# Patient Record
Sex: Female | Born: 1990 | Race: White | Hispanic: No | Marital: Single | Smoking: Never smoker
Health system: Southern US, Community
[De-identification: ages and names within clinical notes are randomized; demographics above are authoritative.]

## PROBLEM LIST (undated history)

## (undated) ENCOUNTER — Inpatient Hospital Stay (HOSPITAL_COMMUNITY): Payer: Self-pay

## (undated) DIAGNOSIS — N730 Acute parametritis and pelvic cellulitis: Secondary | ICD-10-CM

## (undated) DIAGNOSIS — Z789 Other specified health status: Secondary | ICD-10-CM

## (undated) DIAGNOSIS — D649 Anemia, unspecified: Secondary | ICD-10-CM

## (undated) DIAGNOSIS — T8859XA Other complications of anesthesia, initial encounter: Secondary | ICD-10-CM

## (undated) DIAGNOSIS — T4145XA Adverse effect of unspecified anesthetic, initial encounter: Secondary | ICD-10-CM

## (undated) HISTORY — PX: LAPAROSCOPY: SHX197

## (undated) HISTORY — PX: LEEP: SHX91

---
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
## 2010-05-11 LAB — CBC
HCT: 39.1 % (ref 36.0–46.0)
Hemoglobin: 13.1 g/dL (ref 12.0–15.0)
MCHC: 33.6 g/dL (ref 30.0–36.0)
MCV: 90.8 fL (ref 78.0–100.0)
Platelets: 285 10*3/uL (ref 150–400)
RBC: 4.3 MIL/uL (ref 3.87–5.11)
RDW: 13 % (ref 11.5–15.5)
WBC: 5.6 10*3/uL (ref 4.0–10.5)

## 2010-05-11 LAB — PREGNANCY, URINE: Preg Test, Ur: NEGATIVE

## 2010-08-13 ENCOUNTER — Inpatient Hospital Stay (HOSPITAL_COMMUNITY): Payer: BC Managed Care – PPO

## 2010-08-13 ENCOUNTER — Encounter (HOSPITAL_COMMUNITY): Payer: Self-pay

## 2010-08-13 ENCOUNTER — Inpatient Hospital Stay (HOSPITAL_COMMUNITY)
Admission: AD | Admit: 2010-08-13 | Discharge: 2010-08-13 | Disposition: A | Discharge status: Home / Self Care | Payer: BC Managed Care – PPO | Source: Ambulatory Visit | Attending: Obstetrics and Gynecology | Admitting: Obstetrics and Gynecology

## 2010-08-13 ENCOUNTER — Inpatient Hospital Stay (HOSPITAL_COMMUNITY)
Admission: AD | Admit: 2010-08-13 | Payer: BC Managed Care – PPO | Source: Ambulatory Visit | Admitting: Obstetrics and Gynecology

## 2010-08-13 DIAGNOSIS — O200 Threatened abortion: Secondary | ICD-10-CM | POA: Diagnosis present

## 2010-08-13 DIAGNOSIS — N760 Acute vaginitis: Secondary | ICD-10-CM | POA: Insufficient documentation

## 2010-08-13 DIAGNOSIS — A499 Bacterial infection, unspecified: Secondary | ICD-10-CM | POA: Insufficient documentation

## 2010-08-13 DIAGNOSIS — O2390 Unspecified genitourinary tract infection in pregnancy, unspecified trimester: Secondary | ICD-10-CM | POA: Insufficient documentation

## 2010-08-13 DIAGNOSIS — R109 Unspecified abdominal pain: Secondary | ICD-10-CM | POA: Insufficient documentation

## 2010-08-13 DIAGNOSIS — B9689 Other specified bacterial agents as the cause of diseases classified elsewhere: Secondary | ICD-10-CM | POA: Diagnosis present

## 2010-08-13 HISTORY — DX: Other specified health status: Z78.9

## 2010-08-13 LAB — WET PREP, GENITAL
Trich, Wet Prep: NONE SEEN
Yeast Wet Prep HPF POC: NONE SEEN

## 2010-08-13 LAB — CBC
HCT: 38.2 % (ref 36.0–46.0)
Hemoglobin: 13.2 g/dL (ref 12.0–15.0)
MCH: 30.6 pg (ref 26.0–34.0)
MCHC: 34.6 g/dL (ref 30.0–36.0)
MCV: 88.6 fL (ref 78.0–100.0)
Platelets: 283 10*3/uL (ref 150–400)
RBC: 4.31 MIL/uL (ref 3.87–5.11)
WBC: 8.2 10*3/uL (ref 4.0–10.5)

## 2010-08-13 LAB — COMPREHENSIVE METABOLIC PANEL WITH GFR
ALT: 9 U/L (ref 0–35)
AST: 15 U/L (ref 0–37)
Albumin: 4.2 g/dL (ref 3.5–5.2)
Alkaline Phosphatase: 45 U/L (ref 39–117)
BUN: 10 mg/dL (ref 6–23)
CO2: 21 meq/L (ref 19–32)
Calcium: 9.5 mg/dL (ref 8.4–10.5)
Chloride: 102 meq/L (ref 96–112)
Creatinine, Ser: 0.53 mg/dL (ref 0.50–1.10)
GFR calc Af Amer: >60 mL/min
GFR calc non Af Amer: >60 mL/min
Glucose, Bld: 86 mg/dL (ref 70–99)
Potassium: 3.4 meq/L — ABNORMAL LOW (ref 3.5–5.1)
Sodium: 135 meq/L (ref 135–145)
Total Bilirubin: 0.2 mg/dL — ABNORMAL LOW (ref 0.3–1.2)
Total Protein: 8 g/dL (ref 6.0–8.3)

## 2010-08-13 LAB — ABO/RH: ABO/RH(D): O POS

## 2010-08-13 LAB — HCG, QUANTITATIVE, PREGNANCY: hCG, Beta Chain, Quant, S: 3241 m[IU]/mL — ABNORMAL HIGH (ref <5)

## 2010-08-13 MED ORDER — METRONIDAZOLE 500 MG PO TABS: 500.0000 mg | ORAL_TABLET | Freq: Two times a day (BID) | ORAL | Status: AC

## 2010-08-13 NOTE — Progress Notes (Signed)
Onset of abdominal pain x 2 days, bleeding since last night heavy, this afternoon lighter, none on pantyliner just in the toilet after urination

## 2010-08-13 NOTE — Progress Notes (Signed)
Pt given prescription and follow up lab prescription.  Verbalized understanding.

## 2010-08-13 NOTE — Progress Notes (Signed)
Subjective: Pt is 20y.o. 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  Also reports spotting with wiping since woke this AM.  No fever, chills, GU or respiratory c/o's.  No N/V.  No current meds.  Positive UPT Friday.  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  Pt recently started new job at [employer] and also has additional p/t job, so difficulty arranging appointments during usual office hours. Pt doesn't keep track of menstrual cycles, so unsure LMP, but "thinks"  Around 07/07/10.  Chief Complaint  Patient presents with  . Abdominal Pain    pt reports she did a home preg test Friday night and it was positive, has been cramping x 1 week, states she has had pain off/on since 10/10 when she had a lap for "PID"  . Vaginal Bleeding    blood on tissue when she wipes    History   Social History  . Marital Status: Single    Spouse Name: N/A    Number of Children: N/A  . Years of Education: N/A   Occupational History  . Not on file.   Social History Main Topics  . Smoking status: Never Smoker   . Smokeless tobacco: Not on file  . Alcohol Use: No  . Drug Use: No  . Sexually Active: Yes   Other Topics Concern  . Not on file   Social History Narrative  . No narrative on file   Past Surgical History  Procedure Date  . Laparoscopy 11-2008   OB History    Grav Para Term Preterm Abortions TAB SAB Ect Mult Living   1              Past Medical History  Diagnosis Date  . PID H/o frequent UTI's 2010   Objective: Vital signs in last 24 hours: Temp:  [98.1 F (36.7 C)] 98.1 F (36.7 C) (07/08 1627) Pulse Rate:  [93-96] 93  (07/08 2025) Resp:  [16-18] 18  (07/08 2025) BP: (121-125)/(77-78) 125/77 mmHg (07/08 2025) Weight:  [49.896 kg (110 lb)] 110 lb (49.896 kg) (07/08 1627)     PHYSICAL EXAM: General:  NAD, A&Ox3; tearful on arrival to  room Abdomen:  Soft, NT, no rebound or guarding Pelvic:  Ut Nml shape & size; Spec:  Small amt tan/pink malodorous d/c in vault.  Ovaries/adnexa WNL.  Cx:  Closed, long, firm; slightly stenotic.  Chadwick's sign. Extremities:  WNL Skin:  Warm & dry   Results for orders placed during the hospital encounter of 08/13/10 (from the past 24 hour(s))  CBC     Status: Normal   Collection Time   08/13/10  5:13 PM      Component Value Range   WBC 8.2  4.0 - 10.5 (K/uL)   RBC 4.31  3.87 - 5.11 (MIL/uL)   Hemoglobin 13.2  12.0 - 15.0 (g/dL)   HCT 04.5  40.9 - 81.1 (%)   MCV 88.6  78.0 - 100.0 (fL)   MCH 30.6  26.0 - 34.0 (pg)   MCHC 34.6  30.0 - 36.0 (g/dL)   RDW 91.4  78.2 - 95.6 (%)   Platelets 283  150 - 400 (K/uL)      *Note: This is not all of the results for the requested time period. They were limited due to a high amount of results. Please view the rest in Results Review.  CMET WNL except Potassium slightly low =3.4 Quantitative hcg=3,241 ABO/RH= O positive Wet Prep:  WNL except few clue, few WBC's, moderate bacteria   Studies/Results: US Ob Comp Less 14 Wks  08/13/2010  *RADIOLOGY REPORT*  Clinical Data: Pregnant female with lower abdominal pain and cramping.  Quantitative HCG 3,241.  OBSTETRIC <14 WK [place] AND TRANSVAGINAL OB US  Technique:  Both transabdominal and transvaginal ultrasound examinations were performed for complete evaluation of the gestation as well as the maternal uterus, adnexal regions, and pelvic cul-de-sac.  Transvaginal technique was performed to assess early pregnancy.  Comparison:  None.  Intrauterine gestational sac:  Visualized/normal in shape. Yolk sac: Not visualized. Embryo: Not visualized. Cardiac Activity: Not applicable. MSD: 4.7  mm  5    w 0    d         [place] EDC: 04/15/2011  Maternal uterus/adnexae: Unremarkable.  IMPRESSION: Cystic structure within the uterus is compatible with a gestational sac.  No fetal pole or yolk sac is visualized.  No adnexal mass is  seen.  Differential considerations include normal IUP, missed abortion or ectopic pregnancy.  Recommend follow-up quantitative HCG.  Repeat ultrasound as needed.  Original Report Authenticated By: [id number]   US Ob Transvaginal  08/13/2010  *RADIOLOGY REPORT*  Clinical Data: Pregnant female with lower abdominal pain and cramping.  Quantitative HCG 3,241.  OBSTETRIC <14 WK [place] AND TRANSVAGINAL OB US  Technique:  Both transabdominal and transvaginal ultrasound examinations were performed for complete evaluation of the gestation as well as the maternal uterus, adnexal regions, and pelvic cul-de-sac.  Transvaginal technique was performed to assess early pregnancy.  Comparison:  None.  Intrauterine gestational sac:  Visualized/normal in shape. Yolk sac: Not visualized. Embryo: Not visualized. Cardiac Activity: Not applicable. MSD: 4.7  mm  5    w 0    d         [place] EDC: 04/15/2011  Maternal uterus/adnexae: Unremarkable.  IMPRESSION: Cystic structure within the uterus is compatible with a gestational sac.  No fetal pole or yolk sac is visualized.  No adnexal mass is seen.  Differential considerations include normal IUP, missed abortion or ectopic pregnancy.  Recommend follow-up quantitative HCG.  Repeat ultrasound as needed.  Original Report Authenticated By: [id number]    Assessment:   1. Early pregnancy 2.  Threatened AB (first trimester vaginal bleeding) 3.  Bacterial Vaginosis 4.  Unsure LMP 5.  RH positive 6.  Slight Hypokalemia 7.  H/O PID    Plan: 1.  D/c'd home with bleeding & SAB precautions--given script for outpatient stat quant Tuesday 08/15/10, to do here at [facility] secondary to work schedule 2.  O/c CNM will call patient Tuesday night w/ results, & office will call Wednesday to make f/u ultrasound if indicated, & further POC. 3.  Flagyl 500mg  po bid x7d called into [facility] off [address]. 4.  Obtain OTC PNV 5.  F/U as instructed or prn    Jarrett Albor H

## 2010-08-13 NOTE — Progress Notes (Signed)
Eustace Pen, CNM in with pt.  [place] results discussed.

## 2010-08-15 LAB — GC/CHLAMYDIA PROBE AMP, GENITAL
Chlamydia, DNA Probe: POSITIVE — AB
GC Probe Amp, Genital: NEGATIVE

## 2010-11-10 LAB — COMPREHENSIVE METABOLIC PANEL
ALT: 14 U/L (ref 0–35)
AST: 22 U/L (ref 0–37)
Albumin: 4.1 g/dL (ref 3.5–5.2)
Alkaline Phosphatase: 54 U/L (ref 47–119)
BUN: 9 mg/dL (ref 6–23)
CO2: 23 mEq/L (ref 19–32)
Calcium: 9.7 mg/dL (ref 8.4–10.5)
Chloride: 103 mEq/L (ref 96–112)
Creatinine, Ser: 0.7 mg/dL (ref 0.4–1.2)
Glucose, Bld: 95 mg/dL (ref 70–99)
Potassium: 3.1 mEq/L — ABNORMAL LOW (ref 3.5–5.1)
Sodium: 138 mEq/L (ref 135–145)
Total Bilirubin: 0.5 mg/dL (ref 0.3–1.2)
Total Protein: 7.9 g/dL (ref 6.0–8.3)

## 2010-11-10 LAB — URINE MICROSCOPIC-ADD ON

## 2010-11-10 LAB — DIFFERENTIAL
Basophils Absolute: 0 10*3/uL (ref 0.0–0.1)
Basophils Relative: 0 % (ref 0–1)
Eosinophils Absolute: 0.1 10*3/uL (ref 0.0–1.2)
Eosinophils Relative: 2 % (ref 0–5)
Lymphocytes Relative: 39 % (ref 24–48)
Lymphs Abs: 2.6 10*3/uL (ref 1.1–4.8)
Monocytes Absolute: 1.1 10*3/uL (ref 0.2–1.2)
Monocytes Relative: 16 % — ABNORMAL HIGH (ref 3–11)
Neutro Abs: 2.9 10*3/uL (ref 1.7–8.0)
Neutrophils Relative %: 43 % (ref 43–71)

## 2010-11-10 LAB — URINALYSIS, ROUTINE W REFLEX MICROSCOPIC
Bilirubin Urine: NEGATIVE
Glucose, UA: NEGATIVE mg/dL
Hgb urine dipstick: NEGATIVE
Ketones, ur: NEGATIVE mg/dL
Nitrite: NEGATIVE
Protein, ur: NEGATIVE mg/dL
Specific Gravity, Urine: 1.031 — ABNORMAL HIGH (ref 1.005–1.030)
Urobilinogen, UA: 1 mg/dL (ref 0.0–1.0)
pH: 6 (ref 5.0–8.0)

## 2010-11-10 LAB — CBC
HCT: 42.5 % (ref 36.0–49.0)
Hemoglobin: 14.2 g/dL (ref 12.0–16.0)
MCHC: 33.4 g/dL (ref 31.0–37.0)
MCV: 88 fL (ref 78.0–98.0)
Platelets: 335 10*3/uL (ref 150–400)
RBC: 4.83 MIL/uL (ref 3.80–5.70)
RDW: 14.3 % (ref 11.4–15.5)
WBC: 6.7 10*3/uL (ref 4.5–13.5)

## 2010-11-10 LAB — WET PREP, GENITAL
Clue Cells Wet Prep HPF POC: NONE SEEN
Trich, Wet Prep: NONE SEEN
Yeast Wet Prep HPF POC: NONE SEEN

## 2010-11-10 LAB — URINE CULTURE: Colony Count: 45000

## 2010-11-10 LAB — POCT PREGNANCY, URINE: Preg Test, Ur: NEGATIVE

## 2010-11-10 LAB — GC/CHLAMYDIA PROBE AMP, GENITAL
Chlamydia, DNA Probe: NEGATIVE
GC Probe Amp, Genital: NEGATIVE

## 2011-02-06 NOTE — L&D Delivery Note (Signed)
Delivery Note At 3:47 AM a viable female, "Nancy Stokes",  was delivered via Vaginal, Spontaneous Delivery (Presentation: ; Occiput Anterior).  APGAR: 8, 9; weight 7 lb 7.8 oz (3395 g).   Placenta status: Intact, Spontaneous.  Cord: 3 vessels with the following complications: None.  Cord pH: None  Anesthesia: Epidural  Episiotomy: None Lacerations: Bilateral 1st degree periuretheral--repaired for hemostatsis Suture Repair: 3.0 vicryl Est. Blood Loss (mL): 200 cc  Mom to postpartum.  Baby to skin to skin. Circumcision planned--consent signed.  Nigel Bridgeman 04/21/2011, 4:26 AM

## 2011-03-22 LAB — STREP B DNA PROBE: GBS: POSITIVE

## 2011-04-03 ENCOUNTER — Encounter (INDEPENDENT_AMBULATORY_CARE_PROVIDER_SITE_OTHER): Payer: BC Managed Care – PPO | Admitting: Registered Nurse

## 2011-04-03 DIAGNOSIS — Z331 Pregnant state, incidental: Secondary | ICD-10-CM

## 2011-04-11 ENCOUNTER — Encounter: Payer: BC Managed Care – PPO | Admitting: Obstetrics and Gynecology

## 2011-04-16 ENCOUNTER — Encounter (HOSPITAL_COMMUNITY): Payer: Self-pay | Admitting: *Deleted

## 2011-04-16 ENCOUNTER — Inpatient Hospital Stay (HOSPITAL_COMMUNITY)
Admission: AD | Admit: 2011-04-16 | Discharge: 2011-04-16 | Disposition: A | Discharge status: Home Health | Payer: BC Managed Care – PPO | Source: Ambulatory Visit | Attending: Obstetrics and Gynecology | Admitting: Obstetrics and Gynecology

## 2011-04-16 DIAGNOSIS — O479 False labor, unspecified: Secondary | ICD-10-CM

## 2011-04-16 DIAGNOSIS — B951 Streptococcus, group B, as the cause of diseases classified elsewhere: Secondary | ICD-10-CM

## 2011-04-16 HISTORY — DX: Acute parametritis and pelvic cellulitis: N73.0

## 2011-04-16 NOTE — MAU Provider Note (Signed)
  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  Reports ctxs since around supper, but is having difficulty timing them.  Reported to RN maybe around every 10 minutes.  Episode around 1800 where reported feeling "underwear wet," but no LOF since and no pad worn to MAU.  Denies VB, UTI or PIH s/s.  Reports GFM.  No resp or GI c/o's.  Pt reports ctxs irregular--closer sometimes, and then space at times. Pregnancy r/f: 1.  GBS pos 2.  Unsure LMP 3.  H/o std's (chlamydia x2 in pregnancy) 4.  1st trimester VB  CSN: [id number]  Arrival date and time: 04/16/11 2136   First Provider Initiated Contact with Patient 04/16/11 2226      Chief Complaint  Patient presents with  . Labor Eval   HPI  OB History    Grav Para Term Preterm Abortions TAB SAB Ect Mult Living   1               Past Medical History  Diagnosis Date  . No pertinent past medical history   . PID (acute pelvic inflammatory disease)     Past Surgical History  Procedure Date  . Laparoscopy     History reviewed. No pertinent family history.  History  Substance Use Topics  . Smoking status: Never Smoker   . Smokeless tobacco: Not on file  . Alcohol Use: No    Allergies: No Known Allergies  No prescriptions prior to admission    ROS--see history above Physical Exam   Blood pressure 120/75, pulse 98, temperature 98.4 F (36.9 C), temperature source Oral, resp. rate 18, height 5\' 2"  (1.575 m), weight 66.679 kg (147 lb), last menstrual period 07/07/2010, SpO2 99.00%.  Physical Exam  Constitutional: She is oriented to person, place, and time. She appears well-developed and well-nourished.       Grimaces with some ctxs; pauses w/ some.  Cardiovascular: Normal rate.   Respiratory: Effort normal.  GI: Soft.       gravid  Genitourinary:       Tight 1 cm/50-60/-1; posterior.  No LOF or blood on glove; perineum dry.  Neurological: She is alert and  oriented to person, place, and time.  Skin: Skin is warm and dry.    MAU Course  Procedures 1. NST--140, reactive, moderate variability, no decel; TOCO:  UC's q 2-10min  Assessment and Plan  1.  IUP at 39.6 2.  Threatened labor 3.  No cervical change since exam last week at office 4.  Reactive NST 5.  GBS pos  1.  D/c home w/ labor precautions and FKC 2.  Offered pt Ambien or other therapeutic rest and declined 3.  Has appt 04/20/11 at office, or f/u prn.  Monicia Tse H 04/16/2011, 10:38 PM

## 2011-04-16 NOTE — MAU Note (Signed)
Pt states she had to change her underware tonight @ 1800, did not put a pad on after that, no other "leaking" since 1800.  States contractions are every 10 minutes.

## 2011-04-16 NOTE — Discharge Instructions (Signed)

## 2011-04-19 ENCOUNTER — Inpatient Hospital Stay (HOSPITAL_COMMUNITY)
Admission: AD | Admit: 2011-04-19 | Discharge: 2011-04-19 | Disposition: A | Discharge status: Home / Self Care | Payer: BC Managed Care – PPO | Source: Ambulatory Visit | Attending: Obstetrics and Gynecology | Admitting: Obstetrics and Gynecology

## 2011-04-19 ENCOUNTER — Encounter: Payer: BC Managed Care – PPO | Admitting: Registered Nurse

## 2011-04-19 ENCOUNTER — Encounter (HOSPITAL_COMMUNITY): Payer: Self-pay | Admitting: *Deleted

## 2011-04-19 DIAGNOSIS — N760 Acute vaginitis: Secondary | ICD-10-CM | POA: Insufficient documentation

## 2011-04-19 DIAGNOSIS — O2390 Unspecified genitourinary tract infection in pregnancy, unspecified trimester: Secondary | ICD-10-CM | POA: Insufficient documentation

## 2011-04-19 DIAGNOSIS — A499 Bacterial infection, unspecified: Secondary | ICD-10-CM | POA: Insufficient documentation

## 2011-04-19 DIAGNOSIS — O200 Threatened abortion: Secondary | ICD-10-CM | POA: Insufficient documentation

## 2011-04-19 DIAGNOSIS — O479 False labor, unspecified: Secondary | ICD-10-CM | POA: Insufficient documentation

## 2011-04-19 DIAGNOSIS — B9689 Other specified bacterial agents as the cause of diseases classified elsewhere: Secondary | ICD-10-CM | POA: Insufficient documentation

## 2011-04-19 MED ORDER — ZOLPIDEM TARTRATE 10 MG PO TABS
10.0000 mg | ORAL_TABLET | Freq: Once | ORAL | Status: AC
  Administered 2011-04-19: 10 mg via ORAL
  Filled 2011-04-19: qty 1

## 2011-04-19 NOTE — Treatment Plan (Signed)
Phone call to V. Emilee Hero, CNM, update on patients status, chief complaint, gravida.  Orders for RN to check cervix.

## 2011-04-19 NOTE — Treatment Plan (Signed)
Telephone call to Nigel Bridgeman, cnm.  Notified of cervical exam.  Pt may walk x 1 hour

## 2011-04-19 NOTE — MAU Note (Signed)
Started having contractions around 1pm

## 2011-04-19 NOTE — MAU Note (Signed)
Telephone call to V latham, CNM.  Cervical exam remains the same fetal heart tracing reactive and reassuring.  Pt may have ambien and d/c home

## 2011-04-20 ENCOUNTER — Encounter (HOSPITAL_COMMUNITY): Payer: Self-pay | Admitting: Anesthesiology

## 2011-04-20 ENCOUNTER — Inpatient Hospital Stay (HOSPITAL_COMMUNITY)
Admission: AD | Admit: 2011-04-20 | Discharge: 2011-04-23 | DRG: 373 | Disposition: A | Discharge status: Home / Self Care | Payer: BC Managed Care – PPO | Source: Ambulatory Visit | Attending: Obstetrics and Gynecology | Admitting: Obstetrics and Gynecology

## 2011-04-20 ENCOUNTER — Encounter (HOSPITAL_COMMUNITY): Payer: Self-pay | Admitting: *Deleted

## 2011-04-20 ENCOUNTER — Encounter (INDEPENDENT_AMBULATORY_CARE_PROVIDER_SITE_OTHER): Payer: BC Managed Care – PPO | Admitting: Registered Nurse

## 2011-04-20 ENCOUNTER — Inpatient Hospital Stay (HOSPITAL_COMMUNITY): Payer: BC Managed Care – PPO | Admitting: Anesthesiology

## 2011-04-20 DIAGNOSIS — N760 Acute vaginitis: Secondary | ICD-10-CM

## 2011-04-20 DIAGNOSIS — O99892 Other specified diseases and conditions complicating childbirth: Secondary | ICD-10-CM | POA: Diagnosis present

## 2011-04-20 DIAGNOSIS — O99820 Streptococcus B carrier state complicating pregnancy: Secondary | ICD-10-CM

## 2011-04-20 DIAGNOSIS — Z22330 Carrier of Group B streptococcus: Secondary | ICD-10-CM

## 2011-04-20 DIAGNOSIS — Z331 Pregnant state, incidental: Secondary | ICD-10-CM

## 2011-04-20 DIAGNOSIS — IMO0001 Reserved for inherently not codable concepts without codable children: Secondary | ICD-10-CM

## 2011-04-20 DIAGNOSIS — O200 Threatened abortion: Secondary | ICD-10-CM

## 2011-04-20 LAB — CBC
HCT: 33.5 % — ABNORMAL LOW (ref 36.0–46.0)
Platelets: 297 10*3/uL (ref 150–400)
RBC: 4.27 MIL/uL (ref 3.87–5.11)
RDW: 15.4 % (ref 11.5–15.5)
WBC: 14 10*3/uL — ABNORMAL HIGH (ref 4.0–10.5)

## 2011-04-20 LAB — RAPID HIV SCREEN (WH-MAU): Rapid HIV Screen: NONREACTIVE

## 2011-04-20 MED ORDER — ONDANSETRON HCL 4 MG/2ML IJ SOLN
4.0000 mg | Freq: Four times a day (QID) | INTRAMUSCULAR | Status: DC | PRN
  Administered 2011-04-20: 4 mg via INTRAVENOUS
  Filled 2011-04-20: qty 2

## 2011-04-20 MED ORDER — CITRIC ACID-SODIUM CITRATE 334-500 MG/5ML PO SOLN: 30.0000 mL | ORAL | Status: DC | PRN

## 2011-04-20 MED ORDER — TERBUTALINE SULFATE 1 MG/ML IJ SOLN: 0.2500 mg | Freq: Once | INTRAMUSCULAR | Status: AC | PRN

## 2011-04-20 MED ORDER — PHENYLEPHRINE 40 MCG/ML (10ML) SYRINGE FOR IV PUSH (FOR BLOOD PRESSURE SUPPORT): 80.0000 ug | PREFILLED_SYRINGE | INTRAVENOUS | Status: DC | PRN

## 2011-04-20 MED ORDER — LIDOCAINE HCL (PF) 1 % IJ SOLN
30.0000 mL | INTRAMUSCULAR | Status: DC | PRN
  Administered 2011-04-21: 30 mL via SUBCUTANEOUS
  Filled 2011-04-20: qty 30

## 2011-04-20 MED ORDER — EPHEDRINE 5 MG/ML INJ: 10.0000 mg | INTRAVENOUS | Status: DC | PRN

## 2011-04-20 MED ORDER — DEXTROSE 5 % IV SOLN
2.5000 10*6.[IU] | INTRAVENOUS | Status: DC
  Administered 2011-04-20 – 2011-04-21 (×2): 2.5 10*6.[IU] via INTRAVENOUS
  Filled 2011-04-20 (×6): qty 2.5

## 2011-04-20 MED ORDER — LACTATED RINGERS IV SOLN
500.0000 mL | Freq: Once | INTRAVENOUS | Status: AC
  Administered 2011-04-20: 500 mL via INTRAVENOUS

## 2011-04-20 MED ORDER — OXYTOCIN 20 UNITS IN LACTATED RINGERS INFUSION - SIMPLE
125.0000 mL/h | Freq: Once | INTRAVENOUS | Status: AC
  Administered 2011-04-21: 500 mL/h via INTRAVENOUS

## 2011-04-20 MED ORDER — IBUPROFEN 600 MG PO TABS
600.0000 mg | ORAL_TABLET | Freq: Four times a day (QID) | ORAL | Status: DC | PRN
  Administered 2011-04-21: 600 mg via ORAL
  Filled 2011-04-20: qty 1

## 2011-04-20 MED ORDER — ACETAMINOPHEN 325 MG PO TABS: 650.0000 mg | ORAL_TABLET | ORAL | Status: DC | PRN

## 2011-04-20 MED ORDER — LACTATED RINGERS IV SOLN
500.0000 mL | INTRAVENOUS | Status: DC | PRN
  Administered 2011-04-20: 500 mL via INTRAVENOUS
  Administered 2011-04-21: 999 mL via INTRAVENOUS

## 2011-04-20 MED ORDER — OXYTOCIN 20 UNITS IN LACTATED RINGERS INFUSION - SIMPLE
1.0000 m[IU]/min | INTRAVENOUS | Status: DC
  Administered 2011-04-20: 1 m[IU]/min via INTRAVENOUS
  Filled 2011-04-20: qty 1000

## 2011-04-20 MED ORDER — PHENYLEPHRINE 40 MCG/ML (10ML) SYRINGE FOR IV PUSH (FOR BLOOD PRESSURE SUPPORT)
80.0000 ug | PREFILLED_SYRINGE | INTRAVENOUS | Status: DC | PRN
  Filled 2011-04-20: qty 5

## 2011-04-20 MED ORDER — LACTATED RINGERS IV SOLN
INTRAVENOUS | Status: DC
  Administered 2011-04-20: 125 mL/h via INTRAVENOUS
  Administered 2011-04-21: via INTRAVENOUS

## 2011-04-20 MED ORDER — LIDOCAINE HCL (PF) 1 % IJ SOLN
INTRAMUSCULAR | Status: DC | PRN
  Administered 2011-04-20 (×2): 5 mL

## 2011-04-20 MED ORDER — FENTANYL 2.5 MCG/ML BUPIVACAINE 1/10 % EPIDURAL INFUSION (WH - ANES)
14.0000 mL/h | INTRAMUSCULAR | Status: DC
  Administered 2011-04-20 – 2011-04-21 (×4): 14 mL/h via EPIDURAL
  Filled 2011-04-20 (×4): qty 60

## 2011-04-20 MED ORDER — DIPHENHYDRAMINE HCL 50 MG/ML IJ SOLN: 12.5000 mg | INTRAMUSCULAR | Status: DC | PRN

## 2011-04-20 MED ORDER — FLEET ENEMA 7-19 GM/118ML RE ENEM: 1.0000 | ENEMA | RECTAL | Status: DC | PRN

## 2011-04-20 MED ORDER — OXYTOCIN BOLUS FROM INFUSION
500.0000 mL | Freq: Once | INTRAVENOUS | Status: DC
  Filled 2011-04-20: qty 500

## 2011-04-20 MED ORDER — ZOLPIDEM TARTRATE 10 MG PO TABS: 10.0000 mg | ORAL_TABLET | Freq: Every evening | ORAL | Status: DC | PRN

## 2011-04-20 MED ORDER — OXYCODONE-ACETAMINOPHEN 5-325 MG PO TABS: 1.0000 | ORAL_TABLET | ORAL | Status: DC | PRN

## 2011-04-20 MED ORDER — PENICILLIN G POTASSIUM 5000000 UNITS IJ SOLR
5.0000 10*6.[IU] | Freq: Once | INTRAVENOUS | Status: AC
  Administered 2011-04-20: 5 10*6.[IU] via INTRAVENOUS
  Filled 2011-04-20: qty 5

## 2011-04-20 MED ORDER — EPHEDRINE 5 MG/ML INJ
10.0000 mg | INTRAVENOUS | Status: DC | PRN
  Administered 2011-04-20: 10 mg via INTRAVENOUS
  Filled 2011-04-20: qty 4

## 2011-04-20 NOTE — Progress Notes (Signed)
  Subjective: Feeling some back pain, but overall comfortable.  Objective: BP 110/61  Pulse 110  Temp(Src) 98.8 F (37.1 C) (Oral)  Resp 18  Ht 5\' 2"  (1.575 m)  Wt 147 lb (66.679 kg)  BMI 26.89 kg/m2  SpO2 100%  LMP 07/07/2010      FHT:  Category 1 UC:    q 2-3 min SVE:   Dilation: 7 Effacement (%): 80 Station: 0 Exam by:: Cosme Jacob,CNM Pitocin on 1 mu/min    Assessment / Plan: Progressive labor Will continue to observe.   Nigel Bridgeman 04/20/2011, 11:26 PM
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
## 2011-04-20 NOTE — Anesthesia Preprocedure Evaluation (Signed)

## 2011-04-20 NOTE — Anesthesia Procedure Notes (Signed)
Epidural Patient location during procedure: OB Start time: 04/20/2011 3:52 PM  Staffing Anesthesiologist: Brayton Caves R Performed by: anesthesiologist   Preanesthetic Checklist Completed: patient identified, site marked, surgical consent, pre-op evaluation, timeout performed, IV checked, risks and benefits discussed and monitors and equipment checked  Epidural Patient position: sitting Prep: site prepped and draped and DuraPrep Patient monitoring: continuous pulse ox and blood pressure Approach: midline Injection technique: LOR air and LOR saline  Needle:  Needle type: Tuohy  Needle gauge: 17 G Needle length: 9 cm Needle insertion depth: 5 cm cm Catheter type: closed end flexible Catheter size: 19 Gauge Catheter at skin depth: 10 cm Test dose: negative  Assessment Events: blood not aspirated, injection not painful, no injection resistance, negative IV test and no paresthesia  Additional Notes Patient identified.  Risk benefits discussed including failed block, incomplete pain control, headache, nerve damage, paralysis, blood pressure changes, nausea, vomiting, reactions to medication both toxic or allergic, and postpartum back pain.  Patient expressed understanding and wished to proceed.  All questions were answered.  Sterile technique used throughout procedure and epidural site dressed with sterile barrier dressing. No paresthesia or other complications noted.The patient did not experience any signs of intravascular injection such as tinnitus or metallic taste in mouth nor signs of intrathecal spread such as rapid motor block. Please see nursing notes for vital signs.

## 2011-04-20 NOTE — Progress Notes (Signed)
  Subjective: Comfortable with epidural.  Feeling some nausea.  Family at bedside.  Objective: BP 110/68  Pulse 88  Temp(Src) 98.4 F (36.9 C) (Oral)  Resp 20  Ht 5\' 2"  (1.575 m)  Wt 147 lb (66.679 kg)  BMI 26.89 kg/m2  SpO2 100%  LMP 07/07/2010      FHT:  Category 1 UC:   q 4-8 minutes, mild SVE:   Dilation: 7 Effacement (%): 80 Station: 0 Exam by:: Christie Copley,CNM Forewaters noted--AROM, no MSF noted.  Labs: Lab Results  Component Value Date   WBC 14.0* 04/20/2011   HGB 10.9* 04/20/2011   HCT 33.5* 04/20/2011   MCV 78.5 04/20/2011   PLT 297 04/20/2011    Assessment / Plan: Spontaneous labor GBS positive  Plan: Start pitocin augmentation Zofran IV  Katlen Seyer 04/20/2011, 8:34 PM

## 2011-04-20 NOTE — Progress Notes (Signed)
Comfortable with epidural O VSS     Fhts 140 LTV mod accels     abd soft, gravid, nt     uc q 3-5 mild to mod     Vag 5 90 -1 VTX R A not in active labor P anticipate stronger uc now with srom, discussed IV pitocin if indicated. Lavera Guise, CNM

## 2011-04-21 ENCOUNTER — Encounter (HOSPITAL_COMMUNITY): Payer: Self-pay | Admitting: *Deleted

## 2011-04-21 DIAGNOSIS — O99820 Streptococcus B carrier state complicating pregnancy: Secondary | ICD-10-CM

## 2011-04-21 MED ORDER — LANOLIN HYDROUS EX OINT: TOPICAL_OINTMENT | CUTANEOUS | Status: DC | PRN

## 2011-04-21 MED ORDER — OXYCODONE-ACETAMINOPHEN 5-325 MG PO TABS: 1.0000 | ORAL_TABLET | ORAL | Status: DC | PRN

## 2011-04-21 MED ORDER — SENNOSIDES-DOCUSATE SODIUM 8.6-50 MG PO TABS
2.0000 | ORAL_TABLET | Freq: Every day | ORAL | Status: DC
  Administered 2011-04-21: 2 via ORAL

## 2011-04-21 MED ORDER — DIBUCAINE 1 % RE OINT: 1.0000 "application " | TOPICAL_OINTMENT | RECTAL | Status: DC | PRN

## 2011-04-21 MED ORDER — TETANUS-DIPHTH-ACELL PERTUSSIS 5-2.5-18.5 LF-MCG/0.5 IM SUSP: 0.5000 mL | Freq: Once | INTRAMUSCULAR | Status: DC

## 2011-04-21 MED ORDER — BENZOCAINE-MENTHOL 20-0.5 % EX AERO
1.0000 "application " | INHALATION_SPRAY | CUTANEOUS | Status: DC | PRN
  Administered 2011-04-21 – 2011-04-23 (×2): 1 via TOPICAL

## 2011-04-21 MED ORDER — IBUPROFEN 600 MG PO TABS
600.0000 mg | ORAL_TABLET | Freq: Four times a day (QID) | ORAL | Status: DC
  Filled 2011-04-21: qty 1

## 2011-04-21 MED ORDER — SIMETHICONE 80 MG PO CHEW: 80.0000 mg | CHEWABLE_TABLET | ORAL | Status: DC | PRN

## 2011-04-21 MED ORDER — ONDANSETRON HCL 4 MG/2ML IJ SOLN: 4.0000 mg | INTRAMUSCULAR | Status: DC | PRN

## 2011-04-21 MED ORDER — DIPHENHYDRAMINE HCL 25 MG PO CAPS: 25.0000 mg | ORAL_CAPSULE | Freq: Four times a day (QID) | ORAL | Status: DC | PRN

## 2011-04-21 MED ORDER — ZOLPIDEM TARTRATE 5 MG PO TABS: 5.0000 mg | ORAL_TABLET | Freq: Every evening | ORAL | Status: DC | PRN

## 2011-04-21 MED ORDER — PRENATAL MULTIVITAMIN CH
1.0000 | ORAL_TABLET | Freq: Every day | ORAL | Status: DC
  Filled 2011-04-21: qty 1

## 2011-04-21 MED ORDER — COMPLETENATE 29-1 MG PO CHEW
1.0000 | CHEWABLE_TABLET | Freq: Every day | ORAL | Status: DC
  Administered 2011-04-21 – 2011-04-23 (×3): 1 via ORAL
  Filled 2011-04-21 (×3): qty 1

## 2011-04-21 MED ORDER — ONDANSETRON HCL 4 MG PO TABS: 4.0000 mg | ORAL_TABLET | ORAL | Status: DC | PRN

## 2011-04-21 MED ORDER — IBUPROFEN 100 MG/5ML PO SUSP
600.0000 mg | Freq: Four times a day (QID) | ORAL | Status: DC
  Administered 2011-04-21 – 2011-04-23 (×7): 600 mg via ORAL
  Filled 2011-04-21 (×9): qty 30

## 2011-04-21 MED ORDER — BENZOCAINE-MENTHOL 20-0.5 % EX AERO
INHALATION_SPRAY | CUTANEOUS | Status: AC
  Administered 2011-04-21: 1 via TOPICAL
  Filled 2011-04-21: qty 56

## 2011-04-21 MED ORDER — WITCH HAZEL-GLYCERIN EX PADS: 1.0000 "application " | MEDICATED_PAD | CUTANEOUS | Status: DC | PRN

## 2011-04-21 NOTE — Anesthesia Postprocedure Evaluation (Signed)
  Anesthesia Post-op Note  Patient: Nancy Stokes  Procedure(s) Performed: * No procedures listed *  Patient Location: Mother/Baby  Anesthesia Type: Epidural  Level of Consciousness: awake, alert  and oriented  Airway and Oxygen Therapy: Patient Spontanous Breathing  Post-op Pain: mild  Post-op Assessment: Patient's Cardiovascular Status Stable and Respiratory Function Stable  Post-op Vital Signs: stable  Complications: No apparent anesthesia complications

## 2011-04-22 DIAGNOSIS — O2220 Superficial thrombophlebitis in pregnancy, unspecified trimester: Secondary | ICD-10-CM

## 2011-04-22 DIAGNOSIS — I809 Phlebitis and thrombophlebitis of unspecified site: Secondary | ICD-10-CM

## 2011-04-22 NOTE — Progress Notes (Signed)
Post Partum Day 1 Subjective: no complaints.  Up ad lib, using Motrin for back pain and cramping.  Baby doing well, bottlefeeding.  Undecided regarding contraception.  Objective: Blood pressure 113/75, pulse 80, temperature 98.2 F (36.8 C), temperature source Oral, resp. rate 18, height 5\' 2"  (1.575 m), weight 147 lb (66.679 kg), last menstrual period 07/07/2010, SpO2 98.00%, unknown if currently breastfeeding.  Physical Exam:  General: alert Lochia: appropriate Uterine Fundus: firm Incision: healing well DVT Evaluation: No evidence of DVT seen on physical exam.   Basename 04/20/11 1515  HGB 10.9*  HCT 33.5*    Assessment/Plan: Plan for discharge tomorrow    LOS: 2 days   Margi Edmundson 04/22/2011, 7:51 AM

## 2011-04-23 ENCOUNTER — Encounter (HOSPITAL_COMMUNITY): Payer: Self-pay | Admitting: *Deleted

## 2011-04-23 MED ORDER — BENZOCAINE-MENTHOL 20-0.5 % EX AERO
INHALATION_SPRAY | CUTANEOUS | Status: AC
  Administered 2011-04-23: 1 via TOPICAL
  Filled 2011-04-23: qty 56

## 2011-04-23 MED ORDER — IBUPROFEN 100 MG/5ML PO SUSP: 600.0000 mg | Freq: Four times a day (QID) | ORAL | Status: DC

## 2011-04-23 NOTE — Progress Notes (Signed)
Post Partum Day 2 Subjective: no complaints, up ad lib, voiding, tolerating PO, + flatus and BM since delivery.  Formula-feeding.  S.o. at bedside burping newborn.  Undecided on birth control.    Objective: Blood pressure 104/69, pulse 80, temperature 98.1 F (36.7 C), temperature source Oral, resp. rate 18, height 5\' 2"  (1.575 m), weight 66.679 kg (147 lb), last menstrual period 07/07/2010, SpO2 98.00%, unknown if currently breastfeeding.  Physical Exam:  General: alert, cooperative and no distress Breasts:  Bra on. Lochia: appropriate Uterine Fundus: firm; u/-1 Incision: n/a DVT Evaluation: No evidence of DVT seen on physical exam. Negative Homan's sign. No significant calf/ankle edema.   Basename 04/20/11 1515  HGB 10.9*  HCT 33.5*    Assessment/Plan: Discharge home and Contraception Undecided.   F/u 3-4 wks at [facility] or prn; pt desires to return to work 73mo PP. Rx for Motrin susp given.  Tylenol and colace prn.  Abstinence until PP exam.    LOS: 3 days   Kaylei Frink H 04/23/2011, 11:12 AM
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
## 2011-04-23 NOTE — Discharge Instructions (Signed)
Postpartum Care After Vaginal Delivery  After you deliver your baby, you will stay in the hospital for 24 to 72 hours, unless there were problems with the labor or delivery, or you have medical problems. While you are in the hospital, you will receive help and instructions on how to care for yourself and your baby.  Your doctor will order pain medicine, in case you need it. You will have a small amount of bleeding from your vagina and should change your sanitary pad frequently. Wash your hands thoroughly with soap and water for at least 20 seconds after changing pads and using the toilet. Let the nurses know if you begin to pass blood clots or your bleeding increases. Do not flush blood clots down the toilet before having the nurse look at them, to make sure there is no placental tissue with them.  If you had an intravenous (IV), it will be removed within 24 hours, if there are no problems. The first time you get out of bed or take a shower, call the nurse to help you because you may get weak, lightheaded, or even faint. If you are breastfeeding, you may feel painful contractions of your uterus for a couple of weeks. This is normal. The contractions help your uterus get back to normal size. If you are not breastfeeding, wear a supportive bra and handle your breasts as little as possible until your milk has dried up. Hormones should not be given to dry up the breasts, because they can cause blood clots. You will be given your normal diet, unless you have diabetes or other medical problems.   The nurses may put an ice pack on your episiotomy (surgically enlarged opening), if you have one, to reduce the pain and swelling. On rare occasions, you may not be able to urinate and the nurse will need to empty your bladder with a catheter. If you had a postpartum tubal ligation ("tying tubes," female sterilization), it should not make your stay in the hospital longer.  You may have your baby in your room with you as much as  you like, unless you or the baby has a problem. Use the bassinet (basket) for the baby when going to and from the nursery. Do not carry the baby. Do not leave the postpartum area. If the mother is Rh negative (lacks a protein on the red blood cells) and the baby is Rh positive, the mother should get a Rho-gam shot to prevent Rh problems with future pregnancies.  You may be given written instructions for you and your baby, and necessary medicines, when you are discharged from the hospital. Be sure you understand and follow the instructions as advised.  HOME CARE INSTRUCTIONS   · Follow instructions and take the medicines given to you.   · Only take over-the-counter or prescription medicines for pain, discomfort, or fever as directed by your caregiver.   · Do not take aspirin, because it can cause bleeding.   · Increase your activities a little bit every day to build up your strength and endurance.   · Do not drink alcohol, especially if you are breastfeeding or taking pain medicine.   · Take your temperature twice a day and record it.   · You may have a small amount of bleeding or spotting for 2 to 4 weeks. This is normal.   · Do not use tampons or douche. Use sanitary pads.   · Try to have someone stay and help you for a   the baby is sleeping.   If you are breastfeeding, wear a good support bra. If you are not breastfeeding, wear a supportive bra and do not stimulate your nipples.   Eat a healthy, nutritious diet and continue to take your prenatal vitamins.   Do not drive, do any heavy activities, or travel until your caregiver tells you it is okay.   Do not have intercourse until your caregiver gives you permission to do so.   Ask your caregiver when you can begin to exercise and what type of exercises to do.   Call your caregiver if you think you are having a problem from your delivery.   Call your pediatrician if you are having a problem  with the baby.   Schedule your postpartum visit and keep it.  SEEK MEDICAL CARE IF:   You have a temperature of 100 F (37.8 C) or higher.   You have increased vaginal bleeding or are passing clots. Save any clots to show your caregiver.   You have bloody urine or pain when you urinate.   You have a bad smelling vaginal discharge.   You have increasing pain or swelling on your episiotomy.   You develop a severe headache.   You feel depressed.   The episiotomy is separating.   You become dizzy or lightheaded.   You develop a rash.   You have a reaction or problems with your medicine.   You have pain, redness, or swelling at the intravenous site.  SEEK IMMEDIATE MEDICAL CARE IF:   You have chest pain.   You develop shortness of breath.   You pass out.   You develop pain, with or without swelling or redness in your leg.   You develop heavy vaginal bleeding, with or without blood clots.   You develop stomach pain.   You develop a bad smelling vaginal discharge.  MAKE SURE YOU:   Understand these instructions.   Will watch your condition.   Will get help right away if you are not doing well or get worse.  Document Released: 11/19/2006 Document Revised: 01/11/2011 Document Reviewed: 12/01/2008 [facility] Patient Information 2012 [place], [place]. Contraception Choices Contraception (birth control) is the use of any methods or devices to prevent pregnancy. Below are some methods to help avoid pregnancy. HORMONAL METHODS   Contraceptive implant. This is a thin, plastic tube containing progesterone hormone. It does not contain estrogen hormone. Your caregiver inserts the tube in the inner part of the upper arm. The tube can remain in place for up to 3 years. After 3 years, the implant must be removed. The implant prevents the ovaries from releasing an egg (ovulation), thickens the cervical mucus which prevents sperm from entering the uterus, and thins the lining of the  inside of the uterus.   Progesterone-only injections. These injections are given every 3 months by your caregiver to prevent pregnancy. This synthetic progesterone hormone stops the ovaries from releasing eggs. It also thickens cervical mucus and changes the uterine lining. This makes it harder for sperm to survive in the uterus.   Birth control pills. These pills contain estrogen and progesterone hormone. They work by stopping the egg from forming in the ovary (ovulation). Birth control pills are prescribed by a caregiver.Birth control pills can also be used to treat heavy periods.   Minipill. This type of birth control pill contains only the progesterone hormone. They are taken every day of each month and must be prescribed by your caregiver.   Birth  control patch. The patch contains hormones similar to those in birth control pills. It must be changed once a week and is prescribed by a caregiver.   Vaginal ring. The ring contains hormones similar to those in birth control pills. It is left in the vagina for 3 weeks, removed for 1 week, and then a new one is put back in place. The patient must be comfortable inserting and removing the ring from the vagina.A caregiver's prescription is necessary.   Emergency contraception. Emergency contraceptives prevent pregnancy after unprotected sexual intercourse. This pill can be taken right after sex or up to 5 days after unprotected sex. It is most effective the sooner you take the pills after having sexual intercourse. Emergency contraceptive pills are available without a prescription. Check with your pharmacist. Do not use emergency contraception as your only form of birth control.  BARRIER METHODS   Female condom. This is a thin sheath (latex or rubber) that is worn over the penis during sexual intercourse. It can be used with spermicide to increase effectiveness.   Female condom. This is a soft, loose-fitting sheath that is put into the vagina before  sexual intercourse.   Diaphragm. This is a soft, latex, dome-shaped barrier that must be fitted by a caregiver. It is inserted into the vagina, along with a spermicidal jelly. It is inserted before intercourse. The diaphragm should be left in the vagina for 6 to 8 hours after intercourse.   Cervical cap. This is a round, soft, latex or plastic cup that fits over the cervix and must be fitted by a caregiver. The cap can be left in place for up to 48 hours after intercourse.   Sponge. This is a soft, circular piece of polyurethane foam. The sponge has spermicide in it. It is inserted into the vagina after wetting it and before sexual intercourse.   Spermicides. These are chemicals that kill or block sperm from entering the cervix and uterus. They come in the form of creams, jellies, suppositories, foam, or tablets. They do not require a prescription. They are inserted into the vagina with an applicator before having sexual intercourse. The process must be repeated every time you have sexual intercourse.  INTRAUTERINE CONTRACEPTION  Intrauterine device (IUD). This is a T-shaped device that is put in a woman's uterus during a menstrual period to prevent pregnancy. There are 2 types:   Copper IUD. This type of IUD is wrapped in copper wire and is placed inside the uterus. Copper makes the uterus and fallopian tubes produce a fluid that kills sperm. It can stay in place for 10 years.   Hormone IUD. This type of IUD contains the hormone progestin (synthetic progesterone). The hormone thickens the cervical mucus and prevents sperm from entering the uterus, and it also thins the uterine lining to prevent implantation of a fertilized egg. The hormone can weaken or kill the sperm that get into the uterus. It can stay in place for 5 years.  PERMANENT METHODS OF CONTRACEPTION  Female tubal ligation. This is when the woman's fallopian tubes are surgically sealed, tied, or blocked to prevent the egg from  traveling to the uterus.   Female sterilization. This is when the female has the tubes that carry sperm tied off (vasectomy).This blocks sperm from entering the vagina during sexual intercourse. After the procedure, the man can still ejaculate fluid (semen).  NATURAL PLANNING METHODS  Natural family planning. This is not having sexual intercourse or using a barrier method (condom, diaphragm, cervical  cap) on days the woman could become pregnant.   Calendar method. This is keeping track of the length of each menstrual cycle and identifying when you are fertile.   Ovulation method. This is avoiding sexual intercourse during ovulation.   Symptothermal method. This is avoiding sexual intercourse during ovulation, using a thermometer and ovulation symptoms.   Post-ovulation method. This is timing sexual intercourse after you have ovulated.  Regardless of which type or method of contraception you choose, it is important that you use condoms to protect against the transmission of sexually transmitted diseases (STDs). Talk with your caregiver about which form of contraception is most appropriate for you. Document Released: 01/22/2005 Document Revised: 01/11/2011 Document Reviewed: 05/31/2010 [facility] Patient Information 2012 [place], [place].

## 2011-05-14 ENCOUNTER — Ambulatory Visit (INDEPENDENT_AMBULATORY_CARE_PROVIDER_SITE_OTHER): Payer: BC Managed Care – PPO | Admitting: Obstetrics and Gynecology

## 2012-12-29 ENCOUNTER — Encounter (HOSPITAL_COMMUNITY): Payer: Self-pay | Admitting: Emergency Medicine

## 2012-12-29 ENCOUNTER — Emergency Department (INDEPENDENT_AMBULATORY_CARE_PROVIDER_SITE_OTHER)
Admission: EM | Admit: 2012-12-29 | Discharge: 2012-12-29 | Disposition: A | Discharge status: Home / Self Care | Payer: BC Managed Care – PPO | Source: Home / Self Care | Attending: Emergency Medicine | Admitting: Emergency Medicine

## 2012-12-29 DIAGNOSIS — S0001XA Abrasion of scalp, initial encounter: Secondary | ICD-10-CM

## 2012-12-29 DIAGNOSIS — IMO0002 Reserved for concepts with insufficient information to code with codable children: Secondary | ICD-10-CM

## 2012-12-29 DIAGNOSIS — R51 Headache: Secondary | ICD-10-CM

## 2012-12-29 MED ORDER — KETOROLAC TROMETHAMINE 30 MG/ML IJ SOLN: 30.0000 mg | Freq: Once | INTRAMUSCULAR | Status: DC

## 2012-12-29 MED ORDER — KETOROLAC TROMETHAMINE 30 MG/ML IJ SOLN
INTRAMUSCULAR | Status: AC
  Filled 2012-12-29: qty 1

## 2012-12-29 NOTE — ED Provider Notes (Signed)
CSN: [id number]     Arrival date & time 12/29/12  1100 History   First MD Initiated Contact with Patient 12/29/12 1258     Chief Complaint  Patient presents with  . Headache   (Consider location/radiation/quality/duration/timing/severity/associated sxs/prior Treatment) HPI Comments: Pt reports 5 year hx of self-diagnosed migraines. Has not been able to get rid of most recent headache, gets headache daily. This is similar in quality and location to previous headaches, but it won't go away.  Takes excedrin migraine for headaches, last dose was 4 days ago. Also c/o lumps in back of ear for 2 days. Only feels dizzy occasionally when stands up, only lasts a couple of seconds and then goes away. No dizziness when walking or at rest.   Patient is a 22 y.o. female presenting with headaches. The history is provided by the patient.  Headache Pain location:  Occipital Quality:  Dull Radiates to:  Does not radiate Severity currently:  7/10 Onset quality:  Gradual Duration:  2 weeks Timing:  Intermittent Progression:  Waxing and waning Chronicity:  Recurrent Similar to prior headaches: yes   Relieved by:  Nothing Worsened by:  Nothing tried Ineffective treatments: excedrin migraine  Associated symptoms: dizziness   Associated symptoms: no blurred vision, no congestion, no pain, no facial pain, no fever, no nausea, no neck pain, no neck stiffness, no photophobia, no sinus pressure, no visual change and no vomiting     Past Medical History  Diagnosis Date  . No pertinent past medical history   . PID (acute pelvic inflammatory disease)    Past Surgical History  Procedure Laterality Date  . Laparoscopy     History reviewed. No pertinent family history. History  Substance Use Topics  . Smoking status: Never Smoker   . Smokeless tobacco: Not on file  . Alcohol Use: No   OB History   Grav Para Term Preterm Abortions TAB SAB Ect Mult Living   1 1 1  0 0 0 0 0 0 1     Review of Systems   Constitutional: Negative for fever and chills.  HENT: Negative for congestion and sinus pressure.   Eyes: Negative for blurred vision, photophobia, pain and visual disturbance.  Gastrointestinal: Negative for nausea and vomiting.  Musculoskeletal: Negative for neck pain and neck stiffness.  Skin: Negative for wound.  Neurological: Positive for dizziness and headaches.    Allergies  Review of patient's allergies indicates no known allergies.  Home Medications   Current Outpatient Rx  Name  Route  Sig  Dispense  Refill  . ibuprofen (ADVIL,MOTRIN) 100 MG/5ML suspension   Oral   Take 30 mLs (600 mg total) by mouth every 6 (six) hours.   1680 mL   2    BP 111/61  Pulse 91  Temp(Src) 97.6 F (36.4 C) (Oral)  Resp 20  SpO2 99%  LMP 12/22/2012 Physical Exam  Constitutional: She appears well-developed and well-nourished. No distress.  Eyes: Pupils are equal, round, and reactive to light.  Neck: Normal range of motion. Neck supple.  Lymphadenopathy:       Head (right side): Posterior auricular adenopathy present.    She has no cervical adenopathy.  Skin: Skin is warm and dry. Abrasion noted. There is erythema.  Small 3mm abrasion with scab just superior to R ear with 2mm of erythema all around it.     ED Course  Procedures (including critical care time) Labs Review Labs Reviewed - No data to display Imaging Review No results found.  EKG Interpretation    Date/Time:    Ventricular Rate:    PR Interval:    QRS Duration:   QT Interval:    QTC Calculation:   R Axis:     Text Interpretation:              MDM   1. Headache   2. Abrasion, scalp w/o infection   Pt's headache sounds more like tension headache. Given toradol 30mg  IM [facility] at [facility]. If headache returns or persists/worsens, pt to return to [facility].      Cathlyn Parsons, NP 12/29/12 1325

## 2012-12-29 NOTE — ED Provider Notes (Signed)
Medical screening examination/treatment/procedure(s) were performed by non-physician practitioner and as supervising physician I was immediately available for consultation/collaboration.  Taiesha Bovard, M.D.  Torres Hardenbrook C Bonnetta Allbee, MD 12/29/12 1342 

## 2012-12-29 NOTE — ED Notes (Signed)
Headache since 11-14; minimal relief w OTC medications ; also concern about lump x 2 behind right ear

## 2013-02-05 NOTE — L&D Delivery Note (Addendum)
Delivery Note At 1:12 PM a viable female, "Nancy Stokes",was delivered via Vaginal, Spontaneous Delivery (Presentation: LOA ;  ).  APGAR: , ; weight .   Placenta status: spontaneous, intact.  Cord:  with the following complications: None Cord pH: NA  Anesthesia: Epidural  Episiotomy: None Lacerations: None Suture Repair: NA Est. Blood Loss (mL): 250 cc  Mom to postpartum.  Baby to Couplet care / Skin to Skin. Family planning vasectomy for contraception.  Nigel Bridgeman 10/19/2013, 1:31 PM

## 2013-03-03 ENCOUNTER — Encounter (HOSPITAL_COMMUNITY): Payer: Self-pay | Admitting: *Deleted

## 2013-03-03 ENCOUNTER — Inpatient Hospital Stay (HOSPITAL_COMMUNITY)
Admission: AD | Admit: 2013-03-03 | Discharge: 2013-03-04 | Disposition: A | Discharge status: Home / Self Care | Payer: BC Managed Care – PPO | Source: Ambulatory Visit | Attending: Obstetrics & Gynecology | Admitting: Obstetrics & Gynecology

## 2013-03-03 DIAGNOSIS — E860 Dehydration: Secondary | ICD-10-CM | POA: Insufficient documentation

## 2013-03-03 DIAGNOSIS — O211 Hyperemesis gravidarum with metabolic disturbance: Secondary | ICD-10-CM | POA: Insufficient documentation

## 2013-03-03 DIAGNOSIS — O99891 Other specified diseases and conditions complicating pregnancy: Secondary | ICD-10-CM | POA: Insufficient documentation

## 2013-03-03 DIAGNOSIS — A088 Other specified intestinal infections: Secondary | ICD-10-CM

## 2013-03-03 DIAGNOSIS — R197 Diarrhea, unspecified: Secondary | ICD-10-CM | POA: Insufficient documentation

## 2013-03-03 DIAGNOSIS — O9989 Other specified diseases and conditions complicating pregnancy, childbirth and the puerperium: Principal | ICD-10-CM

## 2013-03-03 DIAGNOSIS — A084 Viral intestinal infection, unspecified: Secondary | ICD-10-CM

## 2013-03-03 LAB — URINALYSIS, ROUTINE W REFLEX MICROSCOPIC
BILIRUBIN URINE: NEGATIVE
GLUCOSE, UA: NEGATIVE mg/dL
Hgb urine dipstick: NEGATIVE
Ketones, ur: >80 mg/dL — AB
LEUKOCYTES UA: NEGATIVE
NITRITE: NEGATIVE
PH: 6 (ref 5.0–8.0)
Protein, ur: NEGATIVE mg/dL
Urobilinogen, UA: 0.2 mg/dL (ref 0.0–1.0)

## 2013-03-03 LAB — POCT PREGNANCY, URINE: Preg Test, Ur: POSITIVE — AB

## 2013-03-03 MED ORDER — PROMETHAZINE HCL 25 MG/ML IJ SOLN
25.0000 mg | Freq: Once | INTRAVENOUS | Status: AC
  Administered 2013-03-04: 25 mg via INTRAVENOUS
  Filled 2013-03-03: qty 1

## 2013-03-03 NOTE — MAU Note (Signed)
PT SAYS SHE DID HPT- X2 - POSTIVE-  HAS AN APPOINTMENT  WITH [facility]- ON 2-5.  THEY DEL OTHER  BABY.     VOMITING  -    STARTED TODAY. DIARRHEA- TODAY-   WATERY.   UNSURE IF FEVER.Marland Kitchen.  CRAMPS STARTED THIS AM-.

## 2013-03-03 NOTE — MAU Provider Note (Signed)
History     CSN: [id number]  Arrival date and time: 03/03/13 2016   First Provider Initiated Contact with Patient 03/03/13 2259      Chief Complaint  Patient presents with  . Nausea  . Emesis  . Diarrhea   HPI Ms. Nancy Stokes is a 23 y.o. G2P1001 at [redacted]w[redacted]d who presents to MAU today with complaint of N/V/D since earlier today. The patient states LMP was 01/11/13. She denies N/V prior to today during the pregnancy. She states that her brother was recently sick as well, but believes that he had food poisoning. She states that pain is in the upper and mid abdomen. Denies lower abdominal pain, vaginal bleeding, discharge or fever.   OB History   Grav Para Term Preterm Abortions TAB SAB Ect Mult Living   1 1 1  0 0 0 0 0 0 1      Past Medical History  Diagnosis Date  . No pertinent past medical history   . PID (acute pelvic inflammatory disease)   . Medical history non-contributory     Past Surgical History  Procedure Laterality Date  . Laparoscopy      Family History  Problem Relation Age of Onset  . Cancer Paternal Aunt   . Heart disease Paternal Grandfather   . Stroke Paternal Grandfather     History  Substance Use Topics  . Smoking status: Never Smoker   . Smokeless tobacco: Not on file  . Alcohol Use: No    Allergies: No Known Allergies  Prescriptions prior to admission  Medication Sig Dispense Refill  . ibuprofen (ADVIL,MOTRIN) 100 MG/5ML suspension Take 30 mLs (600 mg total) by mouth every 6 (six) hours.  1680 mL  2    Review of Systems  Constitutional: Negative for fever and malaise/fatigue.  Gastrointestinal: Positive for nausea, vomiting, abdominal pain and diarrhea. Negative for constipation.  Genitourinary:       Neg - vaginal bleeding, discharge  Neurological: Negative for dizziness and weakness.   Physical Exam   Blood pressure 122/71, pulse 107, temperature 98.1 F (36.7 C), temperature source Oral, resp. rate 20, height 5\' 1"  (1.549 m),  weight 158 lb 2 oz (71.725 kg).  Physical Exam  Constitutional: She is oriented to person, place, and time. She appears well-developed and well-nourished. No distress.  HENT:  Head: Normocephalic and atraumatic.  Cardiovascular: Normal rate.   Respiratory: Effort normal.  GI: Soft.  Neurological: She is alert and oriented to person, place, and time.  Skin: Skin is warm and dry. No erythema.  Psychiatric: She has a normal mood and affect.   Results for orders placed during the hospital encounter of 03/03/13 (from the past 24 hour(s))  URINALYSIS, ROUTINE W REFLEX MICROSCOPIC     Status: Abnormal   Collection Time    03/03/13  8:51 PM      Result Value Range   Color, Urine YELLOW  YELLOW   APPearance CLEAR  CLEAR   Specific Gravity, Urine >1.030 (*) 1.005 - 1.030   pH 6.0  5.0 - 8.0   Glucose, UA NEGATIVE  NEGATIVE mg/dL   Hgb urine dipstick NEGATIVE  NEGATIVE   Bilirubin Urine NEGATIVE  NEGATIVE   Ketones, ur >80 (*) NEGATIVE mg/dL   Protein, ur NEGATIVE  NEGATIVE mg/dL   Urobilinogen, UA 0.2  0.0 - 1.0 mg/dL   Nitrite NEGATIVE  NEGATIVE   Leukocytes, UA NEGATIVE  NEGATIVE  POCT PREGNANCY, URINE     Status: Abnormal   Collection  Time    03/03/13 11:29 PM      Result Value Range   Preg Test, Ur POSITIVE (*) NEGATIVE    MAU Course  Procedures None  MDM +UPT UA shows signs of significant dehydration IV phenergan infusion in D5LR ordered CBC and CMP drawn Patient reports improvement in symptoms. No additional episodes of emesis in MAU today Assessment and Plan  A: Viral gastroenteritis  P: Discharge home Rx for Phenergan and Zofran given Advised Imodium PRN for diarrhea Patient advised to increase PO hydration as tolerated Patient given information about hyperemesis and BRAT diet on AVS Patient advised to keep scheduled appointment to start prenatal care with [facility] Patient may return to MAU as needed or if her condition were to change or worsen  Freddi StarrJulie N Ethier,  PA-C  03/03/2013, 10:59 PM

## 2013-03-03 NOTE — MAU Note (Signed)
Pt states she started having diarrhea this morning and pt states she has 6 bm, pt started vomiting this eveing around 6pm.

## 2013-03-04 LAB — COMPREHENSIVE METABOLIC PANEL
ALK PHOS: 52 U/L (ref 39–117)
ALT: 14 U/L (ref 0–35)
AST: 17 U/L (ref 0–37)
Albumin: 4.4 g/dL (ref 3.5–5.2)
BILIRUBIN TOTAL: 0.5 mg/dL (ref 0.3–1.2)
BUN: 11 mg/dL (ref 6–23)
CHLORIDE: 99 meq/L (ref 96–112)
CO2: 23 meq/L (ref 19–32)
Calcium: 9.4 mg/dL (ref 8.4–10.5)
Creatinine, Ser: 0.53 mg/dL (ref 0.50–1.10)
GFR calc Af Amer: >90 mL/min (ref >90)
GLUCOSE: 99 mg/dL (ref 70–99)
Potassium: 3.9 mEq/L (ref 3.7–5.3)
SODIUM: 136 meq/L — AB (ref 137–147)
Total Protein: 7.8 g/dL (ref 6.0–8.3)

## 2013-03-04 LAB — CBC
HCT: 40.5 % (ref 36.0–46.0)
HEMOGLOBIN: 14.5 g/dL (ref 12.0–15.0)
MCH: 30.1 pg (ref 26.0–34.0)
MCHC: 35.8 g/dL (ref 30.0–36.0)
MCV: 84.2 fL (ref 78.0–100.0)
PLATELETS: 266 10*3/uL (ref 150–400)
RBC: 4.81 MIL/uL (ref 3.87–5.11)
RDW: 13.3 % (ref 11.5–15.5)
WBC: 11.1 10*3/uL — AB (ref 4.0–10.5)

## 2013-03-04 MED ORDER — PROMETHAZINE HCL 25 MG PO TABS: 25.0000 mg | ORAL_TABLET | Freq: Four times a day (QID) | ORAL | Status: DC | PRN

## 2013-03-04 MED ORDER — ONDANSETRON HCL 4 MG PO TABS: 4.0000 mg | ORAL_TABLET | Freq: Four times a day (QID) | ORAL | Status: DC

## 2013-03-04 NOTE — Discharge Instructions (Signed)
Diet for Diarrhea, Adult °Frequent, runny stools (diarrhea) may be caused or worsened by food or drink. Diarrhea may be relieved by changing your diet. Since diarrhea can last up to 7 days, it is easy for you to lose too much fluid from the body and become dehydrated. Fluids that are lost need to be replaced. Along with a modified diet, make sure you drink enough fluids to keep your urine clear or pale yellow. °DIET INSTRUCTIONS °· Ensure adequate fluid intake (hydration): have 1 cup (8 oz) of fluid for each diarrhea episode. Avoid fluids that contain simple sugars or sports drinks, fruit juices, whole milk products, and sodas. Your urine should be clear or pale yellow if you are drinking enough fluids. Hydrate with an oral rehydration solution that you can purchase at pharmacies, retail stores, and online. You can prepare an oral rehydration solution at home by mixing the following ingredients together: °·   tsp table salt. °· ¾ tsp baking soda. °·  tsp salt substitute containing potassium chloride. °· 1  tablespoons sugar. °· 1 L (34 oz) of water. °· Certain foods and beverages may increase the speed at which food moves through the gastrointestinal (GI) tract. These foods and beverages should be avoided and include: °· Caffeinated and alcoholic beverages. °· High-fiber foods, such as raw fruits and vegetables, nuts, seeds, and whole grain breads and cereals. °· Foods and beverages sweetened with sugar alcohols, such as xylitol, sorbitol, and mannitol. °· Some foods may be well tolerated and may help thicken stool including: °· Starchy foods, such as rice, toast, pasta, low-sugar cereal, oatmeal, grits, baked potatoes, crackers, and bagels.   °· Bananas.   °· Applesauce. °· Add probiotic-rich foods to help increase healthy bacteria in the GI tract, such as yogurt and fermented milk products. °RECOMMENDED FOODS AND BEVERAGES °Starches °Choose foods with less than 2 g of fiber per serving. °· Recommended:  Raimer,  French, and pita breads, plain rolls, buns, bagels. Plain muffins, matzo. Soda, saltine, or graham crackers. Pretzels, melba toast, zwieback. Cooked cereals made with water: cornmeal, farina, cream cereals. Dry cereals: refined corn, wheat, rice. Potatoes prepared any way without skins, refined macaroni, spaghetti, noodles, refined rice. °· Avoid:  Bread, rolls, or crackers made with whole wheat, multi-grains, rye, bran seeds, nuts, or coconut. Corn tortillas or taco shells. Cereals containing whole grains, multi-grains, bran, coconut, nuts, raisins. Cooked or dry oatmeal. Coarse wheat cereals, granola. Cereals advertised as "high-fiber." Potato skins. Whole grain pasta, wild or brown rice. Popcorn. Sweet potatoes, yams. Sweet rolls, doughnuts, waffles, pancakes, sweet breads. °Vegetables °· Recommended: Strained tomato and vegetable juices. Most well-cooked and canned vegetables without seeds. Fresh: Tender lettuce, cucumber without the skin, cabbage, spinach, bean sprouts. °· Avoid: Fresh, cooked, or canned: Artichokes, baked beans, beet greens, broccoli, Brussels sprouts, corn, kale, legumes, peas, sweet potatoes. Cooked: Green or red cabbage, spinach. Avoid large servings of any vegetables because vegetables shrink when cooked, and they contain more fiber per serving than fresh vegetables. °Fruit °· Recommended: Cooked or canned: Apricots, applesauce, cantaloupe, cherries, fruit cocktail, grapefruit, grapes, kiwi, mandarin oranges, peaches, pears, plums, watermelon. Fresh: Apples without skin, ripe banana, grapes, cantaloupe, cherries, grapefruit, peaches, oranges, plums. Keep servings limited to ½ cup or 1 piece. °· Avoid: Fresh: Apples with skin, apricots, mangoes, pears, raspberries, strawberries. Prune juice, stewed or dried prunes. Dried fruits, raisins, dates. Large servings of all fresh fruits. °Protein °· Recommended: Ground or well-cooked tender beef, ham, veal, lamb, pork, or poultry. Eggs. Fish,  oysters, shrimp,   lobster, other seafoods. Liver, organ meats.  Avoid: Tough, fibrous meats with gristle. Peanut butter, smooth or chunky. Cheese, nuts, seeds, legumes, dried peas, beans, lentils. Dairy  Recommended: Yogurt, lactose-free milk, kefir, drinkable yogurt, buttermilk, soy milk, or plain hard cheese.  Avoid: Milk, chocolate milk, beverages made with milk, such as milkshakes. Soups  Recommended: Bouillon, broth, or soups made from allowed foods. Any strained soup.  Avoid: Soups made from vegetables that are not allowed, cream or milk-based soups. Desserts and Sweets  Recommended: Sugar-free gelatin, sugar-free frozen ice pops made without sugar alcohol.  Avoid: Plain cakes and cookies, pie made with fruit, pudding, custard, cream pie. Gelatin, fruit, ice, sherbet, frozen ice pops. Ice cream, ice milk without nuts. Plain hard candy, honey, jelly, molasses, syrup, sugar, chocolate syrup, gumdrops, marshmallows. Fats and Oils  Recommended: Limit fats to less than 8 tsp per day.  Avoid: Seeds, nuts, olives, avocados. Margarine, butter, cream, mayonnaise, salad oils, plain salad dressings. Plain gravy, crisp bacon without rind. Beverages  Recommended: Water, decaffeinated teas, oral rehydration solutions, sugar-free beverages not sweetened with sugar alcohols.  Avoid: Fruit juices, caffeinated beverages (coffee, tea, soda), alcohol, sports drinks, or lemon-lime soda. Condiments  Recommended: Ketchup, mustard, horseradish, vinegar, cocoa powder. Spices in moderation: allspice, basil, bay leaves, celery powder or leaves, cinnamon, cumin powder, curry powder, ginger, mace, marjoram, onion or garlic powder, oregano, paprika, parsley flakes, ground pepper, rosemary, sage, savory, tarragon, thyme, turmeric.  Avoid: Coconut, honey. Document Released: 04/14/2003 Document Revised: 10/17/2011 Document Reviewed: 06/08/2011 [facility]Exit[facility] Patient Information 2014 [place], [place]. Hyperemesis  Gravidarum Diet Hyperemesis gravidarum is a severe form of morning sickness. It is characterized by frequent and severe vomiting. It happens during the first trimester of pregnancy. It may be caused by the rapid hormone changes that happen during pregnancy. It is associated with a 5% weight loss of pre-pregnancy weight. The hyperemesis diet may be used to lessen symptoms of nausea and vomiting. EATING GUIDELINES  Eat 5 to 6 small meals daily instead of 3 large meals.  Avoid foods with strong smells.  Avoid drinking 30 minutes before and after meals.  Avoid fried or high-fat foods, such as butter and cream sauces.  Starchy foods are usually well-tolerated, such as cereal, toast, bread, potatoes, pasta, rice, and pretzels.  Eat crackers before you get out of bed in the morning.  Avoid spicy foods.  Ginger may help with nausea. Add  tsp ginger to hot tea or choose ginger tea.  Continue to take your prenatal vitamins as directed by your caregiver. SAMPLE MEAL PLAN Breakfast    cup oatmeal  1 slice toast  1 tsp heart-healthy margarine  1 tsp jelly  1 scrambled egg Midmorning Snack   1 cup low-fat yogurt Lunch   Plain ham sandwich  Carrot or celery sticks  1 small apple  3 graham crackers Midafternoon Snack   Cheese and crackers Dinner  4 oz pork tenderloin  1 small baked potato  1 tsp margarine   cup broccoli   cup grapes Evening Snack  1 cup pudding Document Released: 11/19/2006 Document Revised: 04/16/2011 Document Reviewed: 06/24/2012 ExitCare Patient Information 2014 [place], [place].

## 2013-03-12 LAB — OB RESULTS CONSOLE RUBELLA ANTIBODY, IGM: RUBELLA: IMMUNE

## 2013-03-12 LAB — OB RESULTS CONSOLE ABO/RH: RH TYPE: POSITIVE

## 2013-03-12 LAB — OB RESULTS CONSOLE HIV ANTIBODY (ROUTINE TESTING): HIV: NONREACTIVE

## 2013-03-12 LAB — OB RESULTS CONSOLE GC/CHLAMYDIA
Chlamydia: NEGATIVE
Gonorrhea: NEGATIVE

## 2013-03-12 LAB — OB RESULTS CONSOLE RPR: RPR: NONREACTIVE

## 2013-03-12 LAB — OB RESULTS CONSOLE HEPATITIS B SURFACE ANTIGEN: HEP B S AG: NEGATIVE

## 2013-03-12 LAB — OB RESULTS CONSOLE ANTIBODY SCREEN: Antibody Screen: NEGATIVE

## 2013-09-14 ENCOUNTER — Inpatient Hospital Stay (HOSPITAL_COMMUNITY)
Admission: AD | Admit: 2013-09-14 | Discharge: 2013-09-14 | Disposition: A | Discharge status: Home / Self Care | Payer: BC Managed Care – PPO | Source: Ambulatory Visit | Attending: Obstetrics and Gynecology | Admitting: Obstetrics and Gynecology

## 2013-09-14 ENCOUNTER — Inpatient Hospital Stay (HOSPITAL_COMMUNITY): Payer: BC Managed Care – PPO

## 2013-09-14 ENCOUNTER — Encounter (HOSPITAL_COMMUNITY): Payer: Self-pay | Admitting: *Deleted

## 2013-09-14 DIAGNOSIS — O368131 Decreased fetal movements, third trimester, fetus 1: Secondary | ICD-10-CM

## 2013-09-14 DIAGNOSIS — O368190 Decreased fetal movements, unspecified trimester, not applicable or unspecified: Secondary | ICD-10-CM | POA: Insufficient documentation

## 2013-09-14 DIAGNOSIS — O328XX0 Maternal care for other malpresentation of fetus, not applicable or unspecified: Secondary | ICD-10-CM | POA: Insufficient documentation

## 2013-09-14 NOTE — [facility] Provider Note (Signed)
Addendum US FHR 153, posterior placenta, normal fluid, NO abruption seen, compound presentation with foot and hand NST reassuring and reactive Consulted with Dr. Estanislado Pandyivard DC to home  FU in office   Kiven Vangilder, CNM, MSN 09/14/2013. 12:03 PM

## 2013-09-14 NOTE — Discharge Instructions (Signed)
Preterm Labor Information °Preterm labor is when labor starts at less than 37 weeks of pregnancy. The normal length of a pregnancy is 39 to 41 weeks. °CAUSES °Often, there is no identifiable underlying cause as to why a woman goes into preterm labor. One of the most common known causes of preterm labor is infection. Infections of the uterus, cervix, vagina, amniotic sac, bladder, kidney, or even the lungs (pneumonia) can cause labor to start. Other suspected causes of preterm labor include:  °· Urogenital infections, such as yeast infections and bacterial vaginosis.   °· Uterine abnormalities (uterine shape, uterine septum, fibroids, or bleeding from the placenta).   °· A cervix that has been operated on (it may fail to stay closed).   °· Malformations in the fetus.   °· Multiple gestations (twins, triplets, and so on).   °· Breakage of the amniotic sac.   °RISK FACTORS °· Having a previous history of preterm labor.   °· Having premature rupture of membranes (PROM).   °· Having a placenta that covers the opening of the cervix (placenta previa).   °· Having a placenta that separates from the uterus (placental abruption).   °· Having a cervix that is too weak to hold the fetus in the uterus (incompetent cervix).   °· Having too much fluid in the amniotic sac (polyhydramnios).   °· Taking illegal drugs or smoking while pregnant.   °· Not gaining enough weight while pregnant.   °· Being younger than 18 and older than 23 years old.   °· Having a low socioeconomic status.   °· Being African American. °SYMPTOMS °Signs and symptoms of preterm labor include:  °· Menstrual-like cramps, abdominal pain, or back pain. °· Uterine contractions that are regular, as frequent as six in an hour, regardless of their intensity (may be mild or painful). °· Contractions that start on the top of the uterus and spread down to the lower abdomen and back.   °· A sense of increased pelvic pressure.   °· A watery or bloody mucus discharge that  comes from the vagina.   °TREATMENT °Depending on the length of the pregnancy and other circumstances, your health care provider may suggest bed rest. If necessary, there are medicines that can be given to stop contractions and to mature the fetal lungs. If labor happens before 34 weeks of pregnancy, a prolonged hospital stay may be recommended. Treatment depends on the condition of both you and the fetus.  °WHAT SHOULD YOU DO IF YOU THINK YOU ARE IN PRETERM LABOR? °Call your health care provider right away. You will need to go to the hospital to get checked immediately. °HOW CAN YOU PREVENT PRETERM LABOR IN FUTURE PREGNANCIES? °You should:  °· Stop smoking if you smoke.  °· Maintain healthy weight gain and avoid chemicals and drugs that are not necessary. °· Be watchful for any type of infection. °· Inform your health care provider if you have a known history of preterm labor. °Document Released: 04/14/2003 Document Revised: 09/24/2012 Document Reviewed: 02/25/2012 °ExitCare® Patient Information ©2015 [employer], LLC. This information is not intended to replace advice given to you by your health care provider. Make sure you discuss any questions you have with your health care provider. °Fetal Movement Counts °Patient Name: __________________________________________________ Patient Due Date: ____________________ °Performing a fetal movement count is highly recommended in high-risk pregnancies, but it is good for every pregnant woman to do. Your health care provider may ask you to start counting fetal movements at 28 weeks of the pregnancy. Fetal movements often increase: °· After eating a full meal. °· After physical activity. °· After   eating or drinking something sweet or cold. °· At rest. °Pay attention to when you feel the baby is most active. This will help you notice a pattern of your baby's sleep and wake cycles and what factors contribute to an increase in fetal movement. It is important to perform a fetal  movement count at the same time each day when your baby is normally most active.  °HOW TO COUNT FETAL MOVEMENTS °1. Find a quiet and comfortable area to sit or lie down on your left side. Lying on your left side provides the best blood and oxygen circulation to your baby. °2. Write down the day and time on a sheet of paper or in a journal. °3. Start counting kicks, flutters, swishes, rolls, or jabs in a 2-hour period. You should feel at least 10 movements within 2 hours. °4. If you do not feel 10 movements in 2 hours, wait 2-3 hours and count again. Look for a change in the pattern or not enough counts in 2 hours. °SEEK MEDICAL CARE IF: °· You feel less than 10 counts in 2 hours, tried twice. °· There is no movement in over an hour. °· The pattern is changing or taking longer each day to reach 10 counts in 2 hours. °· You feel the baby is not moving as he or she usually does. °Date: ____________ Movements: ____________ Start time: ____________ Finish time: ____________  °Date: ____________ Movements: ____________ Start time: ____________ Finish time: ____________ °Date: ____________ Movements: ____________ Start time: ____________ Finish time: ____________ °Date: ____________ Movements: ____________ Start time: ____________ Finish time: ____________ °Date: ____________ Movements: ____________ Start time: ____________ Finish time: ____________ °Date: ____________ Movements: ____________ Start time: ____________ Finish time: ____________ °Date: ____________ Movements: ____________ Start time: ____________ Finish time: ____________ °Date: ____________ Movements: ____________ Start time: ____________ Finish time: ____________  °Date: ____________ Movements: ____________ Start time: ____________ Finish time: ____________ °Date: ____________ Movements: ____________ Start time: ____________ Finish time: ____________ °Date: ____________ Movements: ____________ Start time: ____________ Finish time: ____________ °Date:  ____________ Movements: ____________ Start time: ____________ Finish time: ____________ °Date: ____________ Movements: ____________ Start time: ____________ Finish time: ____________ °Date: ____________ Movements: ____________ Start time: ____________ Finish time: ____________ °Date: ____________ Movements: ____________ Start time: ____________ Finish time: ____________  °Date: ____________ Movements: ____________ Start time: ____________ Finish time: ____________ °Date: ____________ Movements: ____________ Start time: ____________ Finish time: ____________ °Date: ____________ Movements: ____________ Start time: ____________ Finish time: ____________ °Date: ____________ Movements: ____________ Start time: ____________ Finish time: ____________ °Date: ____________ Movements: ____________ Start time: ____________ Finish time: ____________ °Date: ____________ Movements: ____________ Start time: ____________ Finish time: ____________ °Date: ____________ Movements: ____________ Start time: ____________ Finish time: ____________  °Date: ____________ Movements: ____________ Start time: ____________ Finish time: ____________ °Date: ____________ Movements: ____________ Start time: ____________ Finish time: ____________ °Date: ____________ Movements: ____________ Start time: ____________ Finish time: ____________ °Date: ____________ Movements: ____________ Start time: ____________ Finish time: ____________ °Date: ____________ Movements: ____________ Start time: ____________ Finish time: ____________ °Date: ____________ Movements: ____________ Start time: ____________ Finish time: ____________ °Date: ____________ Movements: ____________ Start time: ____________ Finish time: ____________  °Date: ____________ Movements: ____________ Start time: ____________ Finish time: ____________ °Date: ____________ Movements: ____________ Start time: ____________ Finish time: ____________ °Date: ____________ Movements: ____________ Start  time: ____________ Finish time: ____________ °Date: ____________ Movements: ____________ Start time: ____________ Finish time: ____________ °Date: ____________ Movements: ____________ Start time: ____________ Finish time: ____________ °Date: ____________ Movements: ____________ Start time: ____________ Finish time: ____________ °Date: ____________ Movements: ____________ Start time: ____________ Finish time: ____________  °Date:   ____________ Movements: ____________ Start time: ____________ Finish time: ____________ °Date: ____________ Movements: ____________ Start time: ____________ Finish time: ____________ °Date: ____________ Movements: ____________ Start time: ____________ Finish time: ____________ °Date: ____________ Movements: ____________ Start time: ____________ Finish time: ____________ °Date: ____________ Movements: ____________ Start time: ____________ Finish time: ____________ °Date: ____________ Movements: ____________ Start time: ____________ Finish time: ____________ °Date: ____________ Movements: ____________ Start time: ____________ Finish time: ____________  °Date: ____________ Movements: ____________ Start time: ____________ Finish time: ____________ °Date: ____________ Movements: ____________ Start time: ____________ Finish time: ____________ °Date: ____________ Movements: ____________ Start time: ____________ Finish time: ____________ °Date: ____________ Movements: ____________ Start time: ____________ Finish time: ____________ °Date: ____________ Movements: ____________ Start time: ____________ Finish time: ____________ °Date: ____________ Movements: ____________ Start time: ____________ Finish time: ____________ °Date: ____________ Movements: ____________ Start time: ____________ Finish time: ____________  °Date: ____________ Movements: ____________ Start time: ____________ Finish time: ____________ °Date: ____________ Movements: ____________ Start time: ____________ Finish time:  ____________ °Date: ____________ Movements: ____________ Start time: ____________ Finish time: ____________ °Date: ____________ Movements: ____________ Start time: ____________ Finish time: ____________ °Date: ____________ Movements: ____________ Start time: ____________ Finish time: ____________ °Date: ____________ Movements: ____________ Start time: ____________ Finish time: ____________ °Document Released: 02/21/2006 Document Revised: 06/08/2013 Document Reviewed: 11/19/2011 °ExitCare® Patient Information ©2015 [employer], LLC. This information is not intended to replace advice given to you by your health care provider. Make sure you discuss any questions you have with your health care provider. ° °

## 2013-09-14 NOTE — [facility] Provider Note (Signed)
Nancy Stokes is a 23 y.o. G2P1 at 35.1 weeks presents to [facility] c/o of decrease fetal movement SP [place]  She states she was walking, about to step up on the curb when her sandle got caught on the top of the curb and she fell.  She is unsure if she hit her abd but she did hit and bruise her right knee and left elbow.  She denies vb, lof or ctx w/+FM since being at [facility]U.     History     Patient Active Problem List   Diagnosis Date Noted  . First degree laceration of perineum, delivered, current hospitalization 04/21/2011  . GBS (group B Streptococcus carrier), +RV culture, currently pregnant 04/21/2011    Chief Complaint  Patient presents with  . Fall   HPI  OB History   Grav Para Term Preterm Abortions TAB SAB Ect Mult Living   2 1 1  0 0 0 0 0 0 1      Past Medical History  Diagnosis Date  . No pertinent past medical history   . PID (acute pelvic inflammatory disease)     Past Surgical History  Procedure Laterality Date  . Laparoscopy      Family History  Problem Relation Age of Onset  . Cancer Paternal Aunt   . Heart disease Paternal Grandfather   . Stroke Paternal Grandfather     History  Substance Use Topics  . Smoking status: Never Smoker   . Smokeless tobacco: Never Used  . Alcohol Use: No    Allergies: No Known Allergies  Prescriptions prior to admission  Medication Sig Dispense Refill  . Prenatal Vit-Fe Fumarate-FA (PRENATAL MULTIVITAMIN) TABS tablet Take 1 tablet by mouth daily at 12 noon.        ROS See HPI above, all other systems are negative  Physical Exam   Blood pressure 103/56, pulse 88, temperature 97.8 F (36.6 C), temperature source Oral, resp. rate 16, height 5' (1.524 m), weight 168 lb (76.204 kg), last menstrual period 01/11/2013.  Physical Exam  Chest: CTA Heart: RRR w/o murmer Ext:  WNL ABD: Soft, non tender to palpation, no rebound or guarding SVE: deferred   ED Course  Assessment: IUP at   35.1weeks Membranes: intact FHR: Category 1 CTX:  none   Plan:  US for placenta damage 4 hr observation Consult with Dr. Francisca Decemberivard   Nancy Stokes, CNM, MSN 09/14/2013. 10:24 AM

## 2013-09-14 NOTE — [facility]U Note (Signed)
Pt states she fell yesterday, has had decreased FM since that time, denies bleeding or LOF.

## 2013-09-23 LAB — OB RESULTS CONSOLE GBS: GBS: NEGATIVE

## 2013-10-13 ENCOUNTER — Encounter (HOSPITAL_COMMUNITY): Payer: Self-pay

## 2013-10-13 ENCOUNTER — Inpatient Hospital Stay (HOSPITAL_COMMUNITY)
Admission: AD | Admit: 2013-10-13 | Discharge: 2013-10-14 | Disposition: A | Discharge status: Home / Self Care | Payer: BC Managed Care – PPO | Source: Ambulatory Visit | Attending: Obstetrics and Gynecology | Admitting: Obstetrics and Gynecology

## 2013-10-13 DIAGNOSIS — G43909 Migraine, unspecified, not intractable, without status migrainosus: Secondary | ICD-10-CM

## 2013-10-13 DIAGNOSIS — O99891 Other specified diseases and conditions complicating pregnancy: Secondary | ICD-10-CM | POA: Diagnosis present

## 2013-10-13 DIAGNOSIS — O9989 Other specified diseases and conditions complicating pregnancy, childbirth and the puerperium: Principal | ICD-10-CM

## 2013-10-13 DIAGNOSIS — Z8619 Personal history of other infectious and parasitic diseases: Secondary | ICD-10-CM

## 2013-10-13 HISTORY — DX: Anemia, unspecified: D64.9

## 2013-10-13 LAB — AMNISURE RUPTURE OF MEMBRANE (ROM) NOT AT ARMC: Amnisure ROM: NEGATIVE

## 2013-10-13 NOTE — MAU Note (Signed)
Fluid leaking at 8:46 pm, clear.  Baby moving well. No bleeding.

## 2013-10-14 DIAGNOSIS — Z8619 Personal history of other infectious and parasitic diseases: Secondary | ICD-10-CM

## 2013-10-14 DIAGNOSIS — G43909 Migraine, unspecified, not intractable, without status migrainosus: Secondary | ICD-10-CM

## 2013-10-14 DIAGNOSIS — O99891 Other specified diseases and conditions complicating pregnancy: Secondary | ICD-10-CM | POA: Diagnosis not present

## 2013-10-14 NOTE — MAU Provider Note (Signed)
History  23 yo G2P1001 @ 39.3 wks presents to [facility]U unannounced w/ c/o single episode of ? LOF at 8:45 pm last evening. States was rocking from side to side when she noted a gush of fluid that "ran down her leg." Did not wear a pad. Reports active fetus and occasional ctx. Denies VB.   Of note - no reported leakage of fluids while in MAU.  Patient Active Problem List   Diagnosis Date Noted  . Migraine, unspecified, without mention of intractable migraine without mention of status migrainosus 10/14/2013  . History of HPV infection 10/14/2013  . First degree laceration of perineum, delivered, current hospitalization 04/21/2011  . GBS (group B Streptococcus carrier), +RV culture, currently pregnant 04/21/2011    Chief Complaint  Patient presents with  . Labor Eval   HPI See above OB History   Grav Para Term Preterm Abortions TAB SAB Ect Mult Living   0 0 0 0 0 0 1     SVD on 04/21/2011 @ 40.5 wks, female infant, birthwt 7-7, Epidural, WHG, no complications  Past Medical History  Diagnosis Date  . No pertinent past medical history   . PID (acute pelvic inflammatory disease)   . Anemia     Past Surgical History  Procedure Laterality Date  . Laparoscopy      Family History  Problem Relation Age of Onset  . Cancer Paternal Aunt   . Heart disease Paternal Grandfather   . Stroke Paternal Grandfather     History  Substance Use Topics  . Smoking status: Never Smoker   . Smokeless tobacco: Never Used  . Alcohol Use: No    Allergies: No Known Allergies  Prescriptions prior to admission  Medication Sig Dispense Refill  . Prenatal Vit-Fe Fumarate-FA (PRENATAL MULTIVITAMIN) TABS tablet Take 1 tablet by mouth daily at 12 noon.        ROS ?LOF +FM Occ ctx Physical Exam   Blood pressure 121/64, pulse 92, temperature 98.4 F (36.9 C), temperature source Oral, resp. rate 18, last menstrual period 01/11/2013. Results for orders placed during the hospital encounter of  10/13/13 (from the past 24 hour(s))  AMNISURE RUPTURE OF MEMBRANE (ROM)     Status: None   Collection Time    10/13/13 11:25 PM      Result Value Ref Range   Amnisure ROM NEGATIVE      Physical Exam Gen: NAD Pelvic: Neg amnisure, cervix long/thick/closed FHR: Cat 1 U/C's: Irregular ED Course  Assessment: Amnisure neg Cat 1 FHRT  Plan: Strict labor precautions Keep office appt tomorrow   Sherre Scarlet CNM, MS 10/14/2013 12:31 AM

## 2013-10-18 ENCOUNTER — Encounter (HOSPITAL_COMMUNITY): Payer: Self-pay | Admitting: *Deleted

## 2013-10-18 ENCOUNTER — Inpatient Hospital Stay (HOSPITAL_COMMUNITY)
Admission: AD | Admit: 2013-10-18 | Discharge: 2013-10-18 | Disposition: A | Discharge status: Home / Self Care | Payer: BC Managed Care – PPO | Source: Ambulatory Visit | Attending: Obstetrics and Gynecology | Admitting: Obstetrics and Gynecology

## 2013-10-18 DIAGNOSIS — O479 False labor, unspecified: Secondary | ICD-10-CM | POA: Diagnosis not present

## 2013-10-18 NOTE — MAU Provider Note (Signed)
History   Patient is a 23y.o. G2P1001 at 40wks who presents, unannounced, for contractions.  Patient reports contractions started at 1am and have been increasing in frequency and intensity.  Patient denies LOF, VB, and reports active fetus.  Patient states she has not had any vaginal exams in the office.   Patient Active Problem List   Diagnosis Date Noted  . Migraine, unspecified, without mention of intractable migraine without mention of status migrainosus 10/14/2013  . History of HPV infection 10/14/2013  . First degree laceration of perineum, delivered, current hospitalization 04/21/2011  . GBS (group B Streptococcus carrier), +RV culture, currently pregnant 04/21/2011    Chief Complaint  Patient presents with  . Contractions   HPI  OB History   Grav Para Term Preterm Abortions TAB SAB Ect Mult Living   0 0 0 0 0 0 1      Past Medical History  Diagnosis Date  . No pertinent past medical history   . PID (acute pelvic inflammatory disease)   . Anemia     Past Surgical History  Procedure Laterality Date  . Laparoscopy      Family History  Problem Relation Age of Onset  . Cancer Paternal Aunt   . Heart disease Paternal Grandfather   . Stroke Paternal Grandfather     History  Substance Use Topics  . Smoking status: Never Smoker   . Smokeless tobacco: Never Used  . Alcohol Use: No    Allergies: No Known Allergies  Prescriptions prior to admission  Medication Sig Dispense Refill  . Prenatal Vit-Fe Fumarate-FA (PRENATAL MULTIVITAMIN) TABS tablet Take 1 tablet by mouth daily at 12 noon.        ROS  See HPI Above Physical Exam   Blood pressure 131/78, pulse 96, temperature 97.9 F (36.6 C), resp. rate 18, height  (1.549 m), weight 170 lb 3.2 oz (77.202 kg), last menstrual period 01/11/2013.  Physical Exam No distress noted VS reviewed and stable Abdomen soft, gravid, NT FHR: 125 bpm, Mod Var, -Decels, +Accels UC: Q2-19mins, palpates mild to  moderate SVE: 2/50/-2 by Alvino Chapel, RN Exam ED Course  Assessment: IUP at 40wks Cat I FT GBS Negative Contractions Early Labor  Plan: -NST reactive, patient given option to ambulate in hall -Will reassess cervix for change upon completion of ambulation  Follow Up (0606) -SVE the same -Patient reports contractions becoming irregular and mild -Discussed option of therapeutic rest, patient declines -Discussed early labor vs active labor -Patient encouraged to call when contractions become more intense and regular -Keep appt as scheduled if not delivered -Call if you have any questions or concerns prior to your next visit.   Dayson Aboud LYNN CNM, MSN 10/18/2013 5:54 AM

## 2013-10-18 NOTE — Discharge Instructions (Signed)
Braxton Hicks Contractions °Contractions of the uterus can occur throughout pregnancy. Contractions are not always a sign that you are in labor.  °WHAT ARE BRAXTON HICKS CONTRACTIONS?  °Contractions that occur before labor are called Braxton Hicks contractions, or false labor. Toward the end of pregnancy (32-34 weeks), these contractions can develop more often and may become more forceful. This is not true labor because these contractions do not result in opening (dilatation) and thinning of the cervix. They are sometimes difficult to tell apart from true labor because these contractions can be forceful and people have different pain tolerances. You should not feel embarrassed if you go to the hospital with false labor. Sometimes, the only way to tell if you are in true labor is for your health care provider to look for changes in the cervix. °If there are no prenatal problems or other health problems associated with the pregnancy, it is completely safe to be sent home with false labor and await the onset of true labor. °HOW CAN YOU TELL THE DIFFERENCE BETWEEN TRUE AND FALSE LABOR? °False Labor °· The contractions of false labor are usually shorter and not as hard as those of true labor.   °· The contractions are usually irregular.   °· The contractions are often felt in the front of the lower abdomen and in the groin.   °· The contractions may go away when you walk around or change positions while lying down.   °· The contractions get weaker and are shorter lasting as time goes on.   °· The contractions do not usually become progressively stronger, regular, and closer together as with true labor.   °True Labor °· Contractions in true labor last 30-70 seconds, become very regular, usually become more intense, and increase in frequency.   °· The contractions do not go away with walking.   °· The discomfort is usually felt in the top of the uterus and spreads to the lower abdomen and low back.   °· True labor can be  determined by your health care provider with an exam. This will show that the cervix is dilating and getting thinner.   °WHAT TO REMEMBER °· Keep up with your usual exercises and follow other instructions given by your health care provider.   °· Take medicines as directed by your health care provider.   °· Keep your regular prenatal appointments.   °· Eat and drink lightly if you think you are going into labor.   °· If Braxton Hicks contractions are making you uncomfortable:   °¨ Change your position from lying down or resting to walking, or from walking to resting.   °¨ Sit and rest in a tub of warm water.   °¨ Drink 2-3 glasses of water. Dehydration may cause these contractions.   °¨ Do slow and deep breathing several times an hour.   °WHEN SHOULD I SEEK IMMEDIATE MEDICAL CARE? °Seek immediate medical care if: °· Your contractions become stronger, more regular, and closer together.   °· You have fluid leaking or gushing from your vagina.   °· You have a fever.   °· You pass blood-tinged mucus.   °· You have vaginal bleeding.   °· You have continuous abdominal pain.   °· You have low back pain that you never had before.   °· You feel your baby's head pushing down and causing pelvic pressure.   °· Your baby is not moving as much as it used to.   °Document Released: 01/22/2005 Document Revised: 01/27/2013 Document Reviewed: 11/03/2012 °[employer]® Patient Information ©2015 [employer]. This information is not intended to replace advice given to you by your health care   provider. Make sure you discuss any questions you have with your health care provider. ° °

## 2013-10-18 NOTE — Progress Notes (Signed)
Gerrit Heck CNM in to see pt and discuss d/c plan. Written and verbal d/c instructions given and understanding voiced.

## 2013-10-18 NOTE — MAU Note (Signed)
Contractions since 0100. Denies leaking fld. Some bloody show

## 2013-10-19 ENCOUNTER — Encounter (HOSPITAL_COMMUNITY): Payer: Self-pay | Admitting: *Deleted

## 2013-10-19 ENCOUNTER — Inpatient Hospital Stay (HOSPITAL_COMMUNITY)
Admission: AD | Admit: 2013-10-19 | Discharge: 2013-10-20 | DRG: 775 | Disposition: A | Discharge status: Home / Self Care | Payer: BC Managed Care – PPO | Source: Ambulatory Visit | Attending: Obstetrics and Gynecology | Admitting: Obstetrics and Gynecology

## 2013-10-19 ENCOUNTER — Encounter (HOSPITAL_COMMUNITY): Payer: BC Managed Care – PPO | Admitting: Anesthesiology

## 2013-10-19 ENCOUNTER — Inpatient Hospital Stay (HOSPITAL_COMMUNITY): Payer: BC Managed Care – PPO | Admitting: Anesthesiology

## 2013-10-19 DIAGNOSIS — Z823 Family history of stroke: Secondary | ICD-10-CM | POA: Diagnosis not present

## 2013-10-19 DIAGNOSIS — O479 False labor, unspecified: Secondary | ICD-10-CM | POA: Diagnosis present

## 2013-10-19 LAB — CBC
HCT: 34.8 % — ABNORMAL LOW (ref 36.0–46.0)
HEMOGLOBIN: 11.2 g/dL — AB (ref 12.0–15.0)
MCH: 25.5 pg — ABNORMAL LOW (ref 26.0–34.0)
MCHC: 32.2 g/dL (ref 30.0–36.0)
MCV: 79.1 fL (ref 78.0–100.0)
Platelets: 252 10*3/uL (ref 150–400)
RBC: 4.4 MIL/uL (ref 3.87–5.11)
RDW: 15.3 % (ref 11.5–15.5)
WBC: 8.9 10*3/uL (ref 4.0–10.5)

## 2013-10-19 LAB — TYPE AND SCREEN
ABO/RH(D): O POS
Antibody Screen: NEGATIVE

## 2013-10-19 LAB — RPR

## 2013-10-19 MED ORDER — ZOLPIDEM TARTRATE 5 MG PO TABS: 5.0000 mg | ORAL_TABLET | Freq: Every evening | ORAL | Status: DC | PRN

## 2013-10-19 MED ORDER — IBUPROFEN 600 MG PO TABS
600.0000 mg | ORAL_TABLET | Freq: Four times a day (QID) | ORAL | Status: DC
  Administered 2013-10-19 – 2013-10-20 (×4): 600 mg via ORAL
  Filled 2013-10-19 (×4): qty 1

## 2013-10-19 MED ORDER — DIPHENHYDRAMINE HCL 25 MG PO CAPS: 25.0000 mg | ORAL_CAPSULE | Freq: Four times a day (QID) | ORAL | Status: DC | PRN

## 2013-10-19 MED ORDER — SIMETHICONE 80 MG PO CHEW: 80.0000 mg | CHEWABLE_TABLET | ORAL | Status: DC | PRN

## 2013-10-19 MED ORDER — OXYCODONE-ACETAMINOPHEN 5-325 MG PO TABS: 2.0000 | ORAL_TABLET | ORAL | Status: DC | PRN

## 2013-10-19 MED ORDER — CITRIC ACID-SODIUM CITRATE 334-500 MG/5ML PO SOLN: 30.0000 mL | ORAL | Status: DC | PRN

## 2013-10-19 MED ORDER — PHENYLEPHRINE 40 MCG/ML (10ML) SYRINGE FOR IV PUSH (FOR BLOOD PRESSURE SUPPORT)
80.0000 ug | PREFILLED_SYRINGE | INTRAVENOUS | Status: DC | PRN
  Filled 2013-10-19: qty 10
  Filled 2013-10-19: qty 2

## 2013-10-19 MED ORDER — EPHEDRINE 5 MG/ML INJ
10.0000 mg | INTRAVENOUS | Status: DC | PRN
  Filled 2013-10-19: qty 4
  Filled 2013-10-19: qty 2

## 2013-10-19 MED ORDER — LACTATED RINGERS IV SOLN: 500.0000 mL | INTRAVENOUS | Status: DC | PRN

## 2013-10-19 MED ORDER — BENZOCAINE-MENTHOL 20-0.5 % EX AERO: 1.0000 "application " | INHALATION_SPRAY | CUTANEOUS | Status: DC | PRN

## 2013-10-19 MED ORDER — ONDANSETRON HCL 4 MG/2ML IJ SOLN: 4.0000 mg | INTRAMUSCULAR | Status: DC | PRN

## 2013-10-19 MED ORDER — WITCH HAZEL-GLYCERIN EX PADS: 1.0000 "application " | MEDICATED_PAD | CUTANEOUS | Status: DC | PRN

## 2013-10-19 MED ORDER — LIDOCAINE HCL (PF) 1 % IJ SOLN
INTRAMUSCULAR | Status: DC | PRN
  Administered 2013-10-19: 4 mL
  Administered 2013-10-19: 8 mL

## 2013-10-19 MED ORDER — OXYCODONE-ACETAMINOPHEN 5-325 MG PO TABS: 1.0000 | ORAL_TABLET | ORAL | Status: DC | PRN

## 2013-10-19 MED ORDER — ONDANSETRON HCL 4 MG PO TABS: 4.0000 mg | ORAL_TABLET | ORAL | Status: DC | PRN

## 2013-10-19 MED ORDER — PHENYLEPHRINE 40 MCG/ML (10ML) SYRINGE FOR IV PUSH (FOR BLOOD PRESSURE SUPPORT)
80.0000 ug | PREFILLED_SYRINGE | INTRAVENOUS | Status: DC | PRN
  Filled 2013-10-19: qty 2

## 2013-10-19 MED ORDER — TETANUS-DIPHTH-ACELL PERTUSSIS 5-2.5-18.5 LF-MCG/0.5 IM SUSP: 0.5000 mL | Freq: Once | INTRAMUSCULAR | Status: DC

## 2013-10-19 MED ORDER — FENTANYL 2.5 MCG/ML BUPIVACAINE 1/10 % EPIDURAL INFUSION (WH - ANES)
INTRAMUSCULAR | Status: DC | PRN
  Administered 2013-10-19: 14 mL/h via EPIDURAL

## 2013-10-19 MED ORDER — LANOLIN HYDROUS EX OINT: TOPICAL_OINTMENT | CUTANEOUS | Status: DC | PRN

## 2013-10-19 MED ORDER — DIBUCAINE 1 % RE OINT: 1.0000 "application " | TOPICAL_OINTMENT | RECTAL | Status: DC | PRN

## 2013-10-19 MED ORDER — OXYTOCIN BOLUS FROM INFUSION
500.0000 mL | INTRAVENOUS | Status: DC
  Administered 2013-10-19: 500 mL via INTRAVENOUS

## 2013-10-19 MED ORDER — DIPHENHYDRAMINE HCL 50 MG/ML IJ SOLN: 12.5000 mg | INTRAMUSCULAR | Status: DC | PRN

## 2013-10-19 MED ORDER — FENTANYL 2.5 MCG/ML BUPIVACAINE 1/10 % EPIDURAL INFUSION (WH - ANES)
14.0000 mL/h | INTRAMUSCULAR | Status: DC | PRN
  Administered 2013-10-19: 14 mL/h via EPIDURAL
  Filled 2013-10-19: qty 125

## 2013-10-19 MED ORDER — ACETAMINOPHEN 325 MG PO TABS: 650.0000 mg | ORAL_TABLET | ORAL | Status: DC | PRN

## 2013-10-19 MED ORDER — LACTATED RINGERS IV SOLN
INTRAVENOUS | Status: DC
  Administered 2013-10-19 (×2): via INTRAVENOUS

## 2013-10-19 MED ORDER — FLEET ENEMA 7-19 GM/118ML RE ENEM: 1.0000 | ENEMA | RECTAL | Status: DC | PRN

## 2013-10-19 MED ORDER — OXYTOCIN 40 UNITS IN LACTATED RINGERS INFUSION - SIMPLE MED
62.5000 mL/h | INTRAVENOUS | Status: DC
  Filled 2013-10-19: qty 1000

## 2013-10-19 MED ORDER — PRENATAL MULTIVITAMIN CH
1.0000 | ORAL_TABLET | Freq: Every day | ORAL | Status: DC
  Administered 2013-10-20: 1 via ORAL
  Filled 2013-10-19: qty 1

## 2013-10-19 MED ORDER — LACTATED RINGERS IV SOLN
500.0000 mL | Freq: Once | INTRAVENOUS | Status: AC
Start: 2013-10-19 — End: 2013-10-19
  Administered 2013-10-19: 500 mL via INTRAVENOUS

## 2013-10-19 MED ORDER — OXYCODONE-ACETAMINOPHEN 5-325 MG PO TABS
1.0000 | ORAL_TABLET | ORAL | Status: DC | PRN
  Administered 2013-10-19: 1 via ORAL
  Filled 2013-10-19: qty 1

## 2013-10-19 MED ORDER — LIDOCAINE HCL (PF) 1 % IJ SOLN
30.0000 mL | INTRAMUSCULAR | Status: DC | PRN
  Filled 2013-10-19: qty 30

## 2013-10-19 MED ORDER — SENNOSIDES-DOCUSATE SODIUM 8.6-50 MG PO TABS
2.0000 | ORAL_TABLET | ORAL | Status: DC
  Filled 2013-10-19: qty 2

## 2013-10-19 MED ORDER — BUTORPHANOL TARTRATE 1 MG/ML IJ SOLN: 1.0000 mg | INTRAMUSCULAR | Status: DC | PRN

## 2013-10-19 MED ORDER — EPHEDRINE 5 MG/ML INJ
10.0000 mg | INTRAVENOUS | Status: DC | PRN
  Filled 2013-10-19: qty 2

## 2013-10-19 MED ORDER — ONDANSETRON HCL 4 MG/2ML IJ SOLN: 4.0000 mg | Freq: Four times a day (QID) | INTRAMUSCULAR | Status: DC | PRN

## 2013-10-19 NOTE — H&P (Signed)
Nancy Stokes is a 23 y.o. female, G2P1001 at 18 1/7 weeks, presenting for strong UCs since 6am, with increased d/c, but no leaking or bleeding.  Reports +FM.  Cervix was 2 cm yesterday in MAU.Marland Kitchen  Patient Active Problem List   Diagnosis Date Noted  . Migraine, unspecified, without mention of intractable migraine without mention of status migrainosus 10/14/2013  . History of HPV infection 10/14/2013    History of present pregnancy: Patient entered care at 11 3/7 weeks.   EDC of 10/18/13 was established by LMP and in agreement with [place] at 12 weeks..   Anatomy scan:  20 1/7 weeks, with normal findings and an posterior placenta.   Additional [place] evaluations:  None.   Significant prenatal events:  E coli urine culture + on 03/12/13, then TOC negative.  No significant complications during pregnancy. Last evaluation:  10/18/13 in MAU, cervix 2 cm, 50%.  OB History   Grav Para Term Preterm Abortions TAB SAB Ect Mult Living   0 0 0 0 0 0 1    2013--SVB, 40 3/7 weeks, female, 7+7, delivered by Renaldo Reel, epidural  Past Medical History  Diagnosis Date  . No pertinent past medical history   . PID (acute pelvic inflammatory disease)   . Anemia   Hx chlamydia in past  Past Surgical History  Procedure Laterality Date  . Laparoscopy     Family History: family history includes Cancer in her paternal aunt; Heart disease in her paternal grandfather; Stroke in her paternal grandfather.  Social History:  reports that she has never smoked. She has never used smokeless tobacco. She reports that she does not drink alcohol or use illicit drugs.  Patient is Caucasian, single, with FOB involved and supportive.  Patient is employed.   Prenatal Transfer Tool  Maternal Diabetes: No Genetic Screening: Normal Maternal Ultrasounds/Referrals: Normal Fetal Ultrasounds or other Referrals:  None Maternal Substance Abuse:  No Significant Maternal Medications:  None Significant Maternal Lab Results: Lab values  include: Group B Strep negative  ROS:  Contractions, +FM  No Known Allergies   Dilation: 4 Effacement (%): 80 Station: -2 Exam by:: V Delontae Lamm CNM Blood pressure 131/81, pulse 90, temperature 97.9 F (36.6 C), temperature source Oral, resp. rate 18, height  (1.549 m), weight 170 lb (77.111 kg), last menstrual period 01/11/2013.  Chest clear Heart RRR without murmur Abd gravid, NT, FH 39 cm Pelvic: As above, IBOW Ext: WNL  FHR: Category 1 UCs:  q 3 min, moderate  Prenatal labs: ABO, Rh: O/Positive/-- (02/05 0000) Antibody: Negative (02/05 0000) Rubella:   Immune RPR: Nonreactive (02/05 0000)  HBsAg: Negative (02/05 0000)  HIV: Non-reactive (02/05 0000)  GBS: Negative (08/19 0000) Sickle cell/Hgb electrophoresis:  NA Pap:  + HPV 04/01/13 GC:  Negative 03/12/13, 09/23/13 Chlamydia:   Negative 03/12/13, 09/23/13 Genetic screenings:  Normal 1st trimester screen and AFP Glucola:  WNL Other:  Hgb 13.3 at NOB, 11.2 at 28 weeks   Assessment/Plan: IUP at 40 1/7 weeks Early labor GBS negative Desires epidural  Plan: Admit to Birthing Suite per consult with Dr. Estanislado Pandy Routine [facility] orders Epidural prn.  Verner Kopischke, Nancy Stokes, Nancy Stokes 10/19/2013, 7:54 AM

## 2013-10-19 NOTE — Anesthesia Procedure Notes (Addendum)
Spinal  Patient location during procedure: OR Start time: 10/19/2013 9:37 AM End time: 10/19/2013 9:41 AM Preanesthetic Checklist Completed: patient identified, site marked, surgical consent, pre-op evaluation, timeout performed, IV checked, risks and benefits discussed and monitors and equipment checked Spinal Block Patient position: sitting Prep: DuraPrep Patient monitoring: heart rate, cardiac monitor, continuous pulse ox and blood pressure Approach: midline Location: L3-4 Injection technique: single-shot Needle Needle type: Sprotte  Needle gauge: 24 G Needle length: 9 cm Assessment Sensory level: T4  Epidural Patient location during procedure: OB Start time: 10/19/2013 9:37 AM End time: 10/19/2013 9:41 AM  Staffing Anesthesiologist: Leilani Able Performed by: anesthesiologist   Preanesthetic Checklist Completed: patient identified, surgical consent, pre-op evaluation, timeout performed, IV checked, risks and benefits discussed and monitors and equipment checked  Epidural Patient position: sitting Prep: site prepped and draped and DuraPrep Patient monitoring: continuous pulse ox and blood pressure Approach: midline Location: L3-L4 Injection technique: LOR air  Needle:  Needle type: Tuohy  Needle gauge: 17 G Needle length: 9 cm and 9 Needle insertion depth: 5 cm cm Catheter type: closed end flexible Catheter size: 19 Gauge Catheter at skin depth: 10 cm Test dose: negative and Other  Assessment Sensory level: T10 Events: blood not aspirated, injection not painful, no injection resistance, negative IV test and no paresthesia  Additional Notes Reason for block:procedure for pain

## 2013-10-19 NOTE — Anesthesia Preprocedure Evaluation (Signed)
Anesthesia Evaluation  Patient identified by MRN, date of birth, ID band Patient awake    Reviewed: Allergy & Precautions, H&P , NPO status , Patient's Chart, lab work & pertinent test results  Airway Mallampati: II TM Distance: >3 FB Neck ROM: full    Dental no notable dental hx.    Pulmonary neg pulmonary ROS,    Pulmonary exam normal       Cardiovascular negative cardio ROS      Neuro/Psych negative psych ROS   GI/Hepatic negative GI ROS, Neg liver ROS,   Endo/Other  negative endocrine ROS  Renal/GU negative Renal ROS     Musculoskeletal   Abdominal Normal abdominal exam  (+)   Peds  Hematology   Anesthesia Other Findings   Reproductive/Obstetrics (+) Pregnancy                           Anesthesia Physical Anesthesia Plan  ASA: II  Anesthesia Plan: Epidural   Post-op Pain Management:    Induction:   Airway Management Planned:   Additional Equipment:   Intra-op Plan:   Post-operative Plan:   Informed Consent: I have reviewed the patients History and Physical, chart, labs and discussed the procedure including the risks, benefits and alternatives for the proposed anesthesia with the patient or authorized representative who has indicated his/her understanding and acceptance.     Plan Discussed with:   Anesthesia Plan Comments:         Anesthesia Quick Evaluation

## 2013-10-19 NOTE — MAU Note (Signed)
C/o ucs since 0600;

## 2013-10-19 NOTE — Progress Notes (Signed)
  Subjective: Comfortable with epidural.  Objective: BP 114/78  Pulse 91  Temp(Src) 98.1 F (36.7 C) (Oral)  Resp 18  Ht  (1.549 m)  Wt 170 lb (77.111 kg)  BMI 32.14 kg/m2  LMP 01/11/2013      FHT: Category 1 UC:   regular, every 3-5 minutes SVE:   Dilation: 5 Effacement (%): 80 Station: -2 Exam by:: Manfred Arch CNM AROM--light MSF   Assessment:  Progressive labor GBS negative Light MSF  Plan: Continue to observe progress--augment prn.  Nigel Bridgeman CNM 10/19/2013, 10:49 AM

## 2013-10-20 LAB — CBC
HEMATOCRIT: 33.7 % — AB (ref 36.0–46.0)
Hemoglobin: 10.6 g/dL — ABNORMAL LOW (ref 12.0–15.0)
MCH: 24.9 pg — ABNORMAL LOW (ref 26.0–34.0)
MCHC: 31.5 g/dL (ref 30.0–36.0)
MCV: 79.1 fL (ref 78.0–100.0)
Platelets: 239 10*3/uL (ref 150–400)
RBC: 4.26 MIL/uL (ref 3.87–5.11)
RDW: 15.5 % (ref 11.5–15.5)
WBC: 11.8 10*3/uL — ABNORMAL HIGH (ref 4.0–10.5)

## 2013-10-20 MED ORDER — OXYCODONE-ACETAMINOPHEN 5-325 MG PO TABS: 1.0000 | ORAL_TABLET | ORAL | Status: DC | PRN

## 2013-10-20 MED ORDER — IBUPROFEN 600 MG PO TABS: 600.0000 mg | ORAL_TABLET | Freq: Four times a day (QID) | ORAL | Status: DC

## 2013-10-20 NOTE — Discharge Summary (Signed)
  Vaginal Delivery Discharge Summary  Nancy Stokes  DOB:    03-06-1990 MRN:    [id number] CSN:    [id number]  Date of admission:                  10/19/13  Date of discharge:                   10/20/13  Procedures this admission:   SVB  Date of Delivery: 10/19/13  Newborn Data:  Live born female  Birth Weight: 7 lb 6 oz (3345 g) APGAR: 9, 10  Home with mother. Name: Gracelynn   History of Present Illness:  Nancy Stokes is a 23 y.o. female, [id number], who presents at [redacted]w[redacted]d weeks gestation. The patient has been followed at the [facility] and Gynecology division of [facility]. She was admitted onset of labor. Her pregnancy has been complicated by:  Patient Active Problem List   Diagnosis Date Noted  . Vaginal delivery 10/19/2013  . Migraine, unspecified, without mention of intractable migraine without mention of status migrainosus 10/14/2013  . History of HPV infection 10/14/2013     Hospital Course:  Admitted 10/19/13 in early labor. Negative GBS. Progressed without augmentation.  Utilized epidural for pain management.  Delivery was performed by Nigel Bridgeman, CNM, without complication. Patient and baby tolerated the procedure without difficulty, with no laceration noted. Infant status was stable and remained in room with mother.  Mother and infant then had an uncomplicated postpartum course, with bottle feeding going well. Mom's physical exam was WNL, and she was discharged home in stable condition on day 1 at her request. Contraception plan was vasectomy.  She received adequate benefit from po pain medications.   Feeding:  bottle  Contraception:  vasectomy  Discharge hemoglobin:  Hemoglobin  Date Value Ref Range Status  10/20/2013 10.6* 12.0 - 15.0 g/dL Final     HCT  Date Value Ref Range Status  10/20/2013 33.7* 36.0 - 46.0 % Final    Discharge Physical Exam:   General: alert Lochia: appropriate Uterine Fundus:  firm Incision: Perineum intact DVT Evaluation: No evidence of DVT seen on physical exam. Negative Homan's sign.  Intrapartum Procedures: spontaneous vaginal delivery Postpartum Procedures: none Complications-Operative and Postpartum: none  Discharge Diagnoses: Term Pregnancy-delivered  Discharge Information:  Activity:           pelvic rest Diet:                routine Medications: Ibuprofen and Percocet Condition:      stable Instructions:     Discharge to: home  Follow-up Information   Follow up with [facility]. Schedule an appointment as soon as possible for a visit in 6 weeks. (Call for any questions or concerns.)    Specialty:  Obstetrics and Gynecology   Contact information:   [address] [place] [address] [phone]       Nigel Bridgeman CNM 10/20/2013 11:40 AM

## 2013-10-20 NOTE — Discharge Instructions (Signed)

## 2013-10-20 NOTE — Anesthesia Postprocedure Evaluation (Signed)
  Anesthesia Post-op Note  Patient: Nancy Stokes  Procedure(s) Performed: * No procedures listed *  Patient Location: PACU and Mother/Baby  Anesthesia Type:Epidural  Level of Consciousness: awake, alert  and oriented  Airway and Oxygen Therapy: Patient Spontanous Breathing  Post-op Pain: mild  Post-op Assessment: Post-op Vital signs reviewed  Post-op Vital Signs: Reviewed and stable  Last Vitals:  Filed Vitals:   10/20/13 0500  BP: 119/68  Pulse: 74  Temp: 36.5 C  Resp: 18    Complications: No apparent anesthesia complications

## 2013-12-07 ENCOUNTER — Encounter (HOSPITAL_COMMUNITY): Payer: Self-pay | Admitting: *Deleted

## 2014-04-19 ENCOUNTER — Other Ambulatory Visit: Payer: Self-pay | Admitting: Obstetrics and Gynecology

## 2014-04-26 ENCOUNTER — Other Ambulatory Visit: Payer: Self-pay | Admitting: Obstetrics and Gynecology

## 2014-04-26 DIAGNOSIS — R820 Chyluria: Secondary | ICD-10-CM

## 2014-04-27 ENCOUNTER — Other Ambulatory Visit: Payer: Self-pay | Admitting: Obstetrics and Gynecology

## 2014-04-27 DIAGNOSIS — N643 Galactorrhea not associated with childbirth: Secondary | ICD-10-CM

## 2014-04-29 ENCOUNTER — Ambulatory Visit
Admission: RE | Admit: 2014-04-29 | Discharge: 2014-04-29 | Disposition: A | Discharge status: Home / Self Care | Payer: BC Managed Care – PPO | Source: Ambulatory Visit | Attending: Obstetrics and Gynecology | Admitting: Obstetrics and Gynecology

## 2014-04-29 DIAGNOSIS — N643 Galactorrhea not associated with childbirth: Secondary | ICD-10-CM

## 2014-05-31 ENCOUNTER — Other Ambulatory Visit: Payer: Self-pay | Admitting: Obstetrics and Gynecology

## 2014-06-16 ENCOUNTER — Encounter (HOSPITAL_COMMUNITY): Payer: Self-pay | Admitting: Emergency Medicine

## 2014-06-16 ENCOUNTER — Emergency Department (HOSPITAL_COMMUNITY)
Admission: EM | Admit: 2014-06-16 | Discharge: 2014-06-16 | Disposition: A | Discharge status: Home / Self Care | Payer: BC Managed Care – PPO | Attending: Emergency Medicine | Admitting: Emergency Medicine

## 2014-06-16 DIAGNOSIS — Z79899 Other long term (current) drug therapy: Secondary | ICD-10-CM | POA: Diagnosis not present

## 2014-06-16 DIAGNOSIS — R079 Chest pain, unspecified: Secondary | ICD-10-CM | POA: Insufficient documentation

## 2014-06-16 DIAGNOSIS — Z3202 Encounter for pregnancy test, result negative: Secondary | ICD-10-CM | POA: Diagnosis not present

## 2014-06-16 DIAGNOSIS — D649 Anemia, unspecified: Secondary | ICD-10-CM | POA: Diagnosis not present

## 2014-06-16 DIAGNOSIS — M546 Pain in thoracic spine: Secondary | ICD-10-CM | POA: Diagnosis not present

## 2014-06-16 DIAGNOSIS — N939 Abnormal uterine and vaginal bleeding, unspecified: Secondary | ICD-10-CM | POA: Insufficient documentation

## 2014-06-16 LAB — CBC WITH DIFFERENTIAL/PLATELET
BASOS PCT: 0 % (ref 0–1)
Basophils Absolute: 0 10*3/uL (ref 0.0–0.1)
EOS ABS: 0 10*3/uL (ref 0.0–0.7)
Eosinophils Relative: 0 % (ref 0–5)
HEMATOCRIT: 42.1 % (ref 36.0–46.0)
Hemoglobin: 14.4 g/dL (ref 12.0–15.0)
Lymphocytes Relative: 12 % (ref 12–46)
Lymphs Abs: 1.3 10*3/uL (ref 0.7–4.0)
MCH: 28.9 pg (ref 26.0–34.0)
MCHC: 34.2 g/dL (ref 30.0–36.0)
MCV: 84.4 fL (ref 78.0–100.0)
MONO ABS: 0.8 10*3/uL (ref 0.1–1.0)
Monocytes Relative: 8 % (ref 3–12)
Neutro Abs: 8.4 10*3/uL — ABNORMAL HIGH (ref 1.7–7.7)
Neutrophils Relative %: 80 % — ABNORMAL HIGH (ref 43–77)
Platelets: 313 10*3/uL (ref 150–400)
RBC: 4.99 MIL/uL (ref 3.87–5.11)
RDW: 14.2 % (ref 11.5–15.5)
WBC: 10.5 10*3/uL (ref 4.0–10.5)

## 2014-06-16 LAB — URINALYSIS, ROUTINE W REFLEX MICROSCOPIC
Bilirubin Urine: NEGATIVE
Glucose, UA: NEGATIVE mg/dL
Ketones, ur: 15 mg/dL — AB
LEUKOCYTES UA: NEGATIVE
NITRITE: NEGATIVE
PH: 6 (ref 5.0–8.0)
Protein, ur: NEGATIVE mg/dL
Specific Gravity, Urine: 1.024 (ref 1.005–1.030)
UROBILINOGEN UA: 0.2 mg/dL (ref 0.0–1.0)

## 2014-06-16 LAB — COMPREHENSIVE METABOLIC PANEL
ALT: 21 U/L (ref 14–54)
ANION GAP: 9 (ref 5–15)
AST: 27 U/L (ref 15–41)
Albumin: 4.5 g/dL (ref 3.5–5.0)
Alkaline Phosphatase: 84 U/L (ref 38–126)
BUN: 14 mg/dL (ref 6–20)
CALCIUM: 9.6 mg/dL (ref 8.9–10.3)
CO2: 24 mmol/L (ref 22–32)
Chloride: 105 mmol/L (ref 101–111)
Creatinine, Ser: 0.6 mg/dL (ref 0.44–1.00)
GFR calc Af Amer: >60 mL/min (ref >60)
GFR calc non Af Amer: >60 mL/min (ref >60)
GLUCOSE: 102 mg/dL — AB (ref 70–99)
Potassium: 3.7 mmol/L (ref 3.5–5.1)
Sodium: 138 mmol/L (ref 135–145)
TOTAL PROTEIN: 8.1 g/dL (ref 6.5–8.1)
Total Bilirubin: 0.2 mg/dL — ABNORMAL LOW (ref 0.3–1.2)

## 2014-06-16 LAB — URINE MICROSCOPIC-ADD ON

## 2014-06-16 LAB — LIPASE, BLOOD: Lipase: 26 U/L (ref 22–51)

## 2014-06-16 LAB — POC URINE PREG, ED: Preg Test, Ur: NEGATIVE

## 2014-06-16 NOTE — ED Notes (Signed)
Pt went home with her family in stable condition via wheelchair  Error in previous care hand off.

## 2014-06-16 NOTE — ED Notes (Signed)
assiting for discharge pt.from monitor

## 2014-06-16 NOTE — Discharge Instructions (Signed)
Please read and follow all provided instructions.  Your diagnoses today include:  1. Chest pain, unspecified chest pain type     Tests performed today include:  An EKG of your heart - normal  Blood counts and electrolytes - normal  Urine test and pregnancy test - no infection or   Vital signs. See below for your results today.   Medications prescribed:   None  Take any prescribed medications only as directed.  Follow-up instructions: Please follow-up with your primary care provider as soon as you can for further evaluation of your symptoms.   Return instructions:  SEEK IMMEDIATE MEDICAL ATTENTION IF:  You have severe chest pain, especially if the pain is crushing or pressure-like and spreads to the arms, back, neck, or jaw, or if you have sweating, nausea (feeling sick to your stomach), or shortness of breath. THIS IS AN EMERGENCY. Don't wait to see if the pain will go away. Get medical help at once. Call 911 or 0 (operator). DO NOT drive yourself to the hospital.   Your chest pain gets worse and does not go away with rest.   You have an attack of chest pain lasting longer than usual, despite rest and treatment with the medications your caregiver has prescribed.   You wake from sleep with chest pain or shortness of breath.  You feel dizzy or faint.  You have chest pain not typical of your usual pain for which you originally saw your caregiver.   You have any other emergent concerns regarding your health.  Additional Information: Chest pain comes from many different causes. Your caregiver has diagnosed you as having chest pain that is not specific for one problem, but does not require admission.  You are at low risk for an acute heart condition or other serious illness.   Your vital signs today were: BP 112/64 mmHg   Pulse 79   Temp(Src) 97.9 F (36.6 C) (Oral)   Resp 17   Ht 5\' 1"  (1.549 m)   Wt 168 lb (76.204 kg)   BMI 31.76 kg/m2   SpO2 99%   LMP 06/05/2014 If your  blood pressure (BP) was elevated above 135/85 this visit, please have this repeated by your doctor within one month. --------------

## 2014-06-16 NOTE — ED Provider Notes (Signed)
CSN: [id number]     Arrival date & time 06/16/14  [id number] History   First MD Initiated Contact with Patient 06/16/14 [phone]     Chief Complaint  Patient presents with  . Abdominal Pain  . Back Pain     (Consider location/radiation/quality/duration/timing/severity/associated sxs/prior Treatment) HPI Comments: Patient with no significant past medical history presents with acute onset of mid chest pain with radiation to her middle back starting at 3:40 AM. Patient awoke from sleep with pain. Pain moved between her chest and her back. It was worse with movements in different positions. Patient did not have any significant shortness of breath, nausea, vomiting. No palpitations, cough, fever. Patient took ibuprofen without any difficulty swallowing. Patient denies risk factors for pulmonary embolism including: unilateral leg swelling, history of DVT/PE/other blood clots, use of estrogens, recent immobilizations, recent surgery leading to immobilization, recent travel (>4hr segment), malignancy, hemoptysis. Patient denies having any abdominal pain to me (contradicting nursing note). No urinary sx.   Patient has been having a mild amount of vaginal bleeding, ongoing since a LEEP approximately 2 weeks ago. Patient was seen by her GYN doctor yesterday who states things are healing well.   Patient is a 24 y.o. female presenting with abdominal pain and back pain. The history is provided by the patient.  Abdominal Pain Associated symptoms: chest pain and vaginal bleeding   Associated symptoms: no cough, no dysuria, no fever, no hematuria, no nausea, no shortness of breath, no vaginal discharge and no vomiting   Back Pain Associated symptoms: chest pain   Associated symptoms: no abdominal pain, no dysuria and no fever     Past Medical History  Diagnosis Date  . No pertinent past medical history   . PID (acute pelvic inflammatory disease)   . Anemia    Past Surgical History  Procedure Laterality Date  .  Laparoscopy    . Leep     Family History  Problem Relation Age of Onset  . Cancer Paternal Aunt   . Heart disease Paternal Grandfather   . Stroke Paternal Grandfather    History  Substance Use Topics  . Smoking status: Never Smoker   . Smokeless tobacco: Never Used  . Alcohol Use: No   OB History    Gravida Para Term Preterm AB TAB SAB Ectopic Multiple Living   2 2 2  0 0 0 0 0 0 2     Review of Systems  Constitutional: Negative for fever and diaphoresis.  Eyes: Negative for redness.  Respiratory: Negative for cough and shortness of breath.   Cardiovascular: Positive for chest pain. Negative for palpitations and leg swelling.  Gastrointestinal: Negative for nausea, vomiting and abdominal pain.  Genitourinary: Positive for vaginal bleeding. Negative for dysuria, frequency, hematuria and vaginal discharge.  Musculoskeletal: Positive for back pain. Negative for neck pain.  Skin: Negative for rash.  Neurological: Negative for syncope and light-headedness.  Psychiatric/Behavioral: The patient is not nervous/anxious.       Allergies  Review of patient's allergies indicates no known allergies.  Home Medications   Prior to Admission medications   Medication Sig Start Date End Date Taking? Authorizing Provider  ibuprofen (ADVIL,MOTRIN) 800 MG tablet Take 800 mg by mouth every 8 (eight) hours as needed for mild pain.   Yes Historical Provider, MD  ibuprofen (ADVIL,MOTRIN) 600 MG tablet Take 1 tablet (600 mg total) by mouth every 6 (six) hours. Patient not taking: Reported on 06/16/2014 10/20/13   Nigel BridgemanVicki Latham, CNM  oxyCODONE-acetaminophen (PERCOCET/ROXICET) 5-325 MG  per tablet Take 1 tablet by mouth every 4 (four) hours as needed (for pain scale less than 7). Patient not taking: Reported on 06/16/2014 10/20/13   Nigel BridgemanVicki Latham, CNM  Prenatal Vit-Fe Fumarate-FA (PRENATAL MULTIVITAMIN) TABS tablet Take 1 tablet by mouth daily at 12 noon.    Historical Provider, MD   BP 138/103 mmHg   Pulse 88  Temp(Src) 97.9 F (36.6 C) (Oral)  Resp 14  Ht 5\' 1"  (1.549 m)  Wt 168 lb (76.204 kg)  BMI 31.76 kg/m2  SpO2 99%  LMP 06/05/2014   Physical Exam  Constitutional: She appears well-developed and well-nourished.  HENT:  Head: Normocephalic and atraumatic.  Right Ear: Tympanic membrane, external ear and ear canal normal.  Left Ear: Tympanic membrane, external ear and ear canal normal.  Nose: Nose normal. No mucosal edema or rhinorrhea.  Mouth/Throat: Oropharynx is clear and moist and mucous membranes are normal. Mucous membranes are not dry.  Eyes: Conjunctivae are normal.  Neck: Trachea normal and normal range of motion. Neck supple. Normal carotid pulses and no JVD present. No muscular tenderness present. Carotid bruit is not present. No tracheal deviation present.  Cardiovascular: Normal rate, regular rhythm, S1 normal, S2 normal, normal heart sounds and intact distal pulses.  Exam reveals no decreased pulses.   No murmur heard. Pulmonary/Chest: Effort normal. No respiratory distress. She has no wheezes. She exhibits no tenderness.  Abdominal: Soft. Normal aorta and bowel sounds are normal. There is no tenderness. There is no rebound and no guarding.  Musculoskeletal: Normal range of motion.  Neurological: She is alert.  Skin: Skin is warm and dry. She is not diaphoretic. No cyanosis. No pallor.  Psychiatric: She has a normal mood and affect.  Nursing note and vitals reviewed.   ED Course  Procedures (including critical care time) Labs Review Labs Reviewed  COMPREHENSIVE METABOLIC PANEL - Abnormal; Notable for the following:    Glucose, Bld 102 (*)    Total Bilirubin 0.2 (*)    All other components within normal limits  CBC WITH DIFFERENTIAL/PLATELET - Abnormal; Notable for the following:    Neutrophils Relative % 80 (*)    Neutro Abs 8.4 (*)    All other components within normal limits  URINALYSIS, ROUTINE W REFLEX MICROSCOPIC - Abnormal; Notable for the  following:    Hgb urine dipstick TRACE (*)    Ketones, ur 15 (*)    All other components within normal limits  URINE MICROSCOPIC-ADD ON - Abnormal; Notable for the following:    Bacteria, UA FEW (*)    All other components within normal limits  LIPASE, BLOOD  POC URINE PREG, ED    Imaging Review No results found.   EKG Interpretation   Date/Time:  Wednesday Jun 16 2014 06:38:47 EDT Ventricular Rate:  76 PR Interval:  127 QRS Duration: 97 QT Interval:  378 QTC Calculation: 425 R Axis:   27 Text Interpretation:  Sinus rhythm Nonspecific T abnormalities, anterior  leads Confirmed by OTTER  MD, OLGA ([id number]) on 06/16/2014 6:44:35 AM      7:41 AM Patient seen and examined. Work-up initiated. Pt appears well. Asymptomatic currently. EKG reviewed.   Vital signs reviewed and are as follows: BP 138/103 mmHg  Pulse 88  Temp(Src) 97.9 F (36.6 C) (Oral)  Resp 14  Ht 5\' 1"  (1.549 m)  Wt 168 lb (76.204 kg)  BMI 31.76 kg/m2  SpO2 99%  LMP 06/05/2014   9:55 AM no further pain or symptoms while in the ED.  Patient informed of all results. She is comfortable with discharge to home with PCP follow-up, return with worsening.  Patient was counseled to return with severe chest pain, especially if the pain is crushing or pressure-like and spreads to the arms, back, neck, or jaw, or if they have sweating, nausea, or shortness of breath with the pain. They were encouraged to call 911 with these symptoms.   They were also told to return if their chest pain gets worse and does not go away with rest, they have an attack of chest pain lasting longer than usual despite rest and treatment with the medications their caregiver has prescribed, if they wake from sleep with chest pain or shortness of breath, if they feel dizzy or faint, if they have chest pain not typical of their usual pain, or if they have any other emergent concerns regarding their health.  The patient verbalized understanding and  agreed.    MDM   Final diagnoses:  Chest pain, unspecified chest pain type   Patient with one hour of chest pain/back pain. EKG is normal. Patient is PERC negative. Workup is unremarkable. No fever or cough to suggest pneumonia. Patient is low risk for ACS given age, atypical story, normal EKG. Question MSK or GI etiology. Story is not that suggestive of biliary colic.   No dangerous or life-threatening conditions suspected or identified by history, physical exam, and by work-up. No indications for hospitalization identified.      Renne Crigler, PA-C 06/16/14 1478  Layla Maw Ward, DO 06/16/14 1447

## 2014-06-16 NOTE — ED Notes (Signed)
Pt. reports mid abdominal and mid back pain with vaginal bleeding onset this morning , denies nausea or vomiting , no fever or chills. Pt. states LEEP procedure 2 weeks ago.

## 2014-06-16 NOTE — ED Notes (Signed)
Pt. Ambulated to restroom independently.

## 2015-02-09 ENCOUNTER — Other Ambulatory Visit: Payer: Self-pay | Admitting: Gastroenterology

## 2015-02-09 DIAGNOSIS — R1013 Epigastric pain: Secondary | ICD-10-CM

## 2015-02-09 DIAGNOSIS — R110 Nausea: Secondary | ICD-10-CM

## 2015-02-16 ENCOUNTER — Ambulatory Visit (HOSPITAL_COMMUNITY)
Admission: RE | Admit: 2015-02-16 | Discharge: 2015-02-16 | Disposition: A | Discharge status: Home / Self Care | Payer: BC Managed Care – PPO | Source: Ambulatory Visit | Attending: Gastroenterology | Admitting: Gastroenterology

## 2015-02-16 DIAGNOSIS — R110 Nausea: Secondary | ICD-10-CM

## 2015-02-16 DIAGNOSIS — R1013 Epigastric pain: Secondary | ICD-10-CM

## 2015-02-16 DIAGNOSIS — R109 Unspecified abdominal pain: Secondary | ICD-10-CM | POA: Insufficient documentation

## 2015-02-16 MED ORDER — TECHNETIUM TC 99M MEBROFENIN IV KIT
5.0000 | PACK | Freq: Once | INTRAVENOUS | Status: AC | PRN
  Administered 2015-02-16: 5 via INTRAVENOUS

## 2015-02-16 MED ORDER — SINCALIDE 5 MCG IJ SOLR
0.0200 ug/kg | Freq: Once | INTRAMUSCULAR | Status: AC
  Administered 2015-02-16: 1.5 ug via INTRAVENOUS

## 2015-02-16 MED ORDER — SINCALIDE 5 MCG IJ SOLR
INTRAMUSCULAR | Status: AC
  Administered 2015-02-16: 1.5 ug via INTRAVENOUS
  Filled 2015-02-16: qty 5

## 2015-02-16 MED ORDER — STERILE WATER FOR INJECTION IJ SOLN
INTRAMUSCULAR | Status: AC
  Administered 2015-02-16: 5 mL
  Filled 2015-02-16: qty 10

## 2016-04-06 ENCOUNTER — Encounter: Payer: Self-pay | Admitting: Obstetrics and Gynecology

## 2016-04-25 IMAGING — US US ABDOMEN COMPLETE
1 series · 14 of 25 positions shown · non-contrast
Comparison: None.

CLINICAL DATA: Midline abdominal pain for 1 month

EXAM:
ABDOMEN ULTRASOUND COMPLETE

[Series 1: us abdomen complete · 0.23mm/px · 14 of 83 slices shown]
[im 1/83]
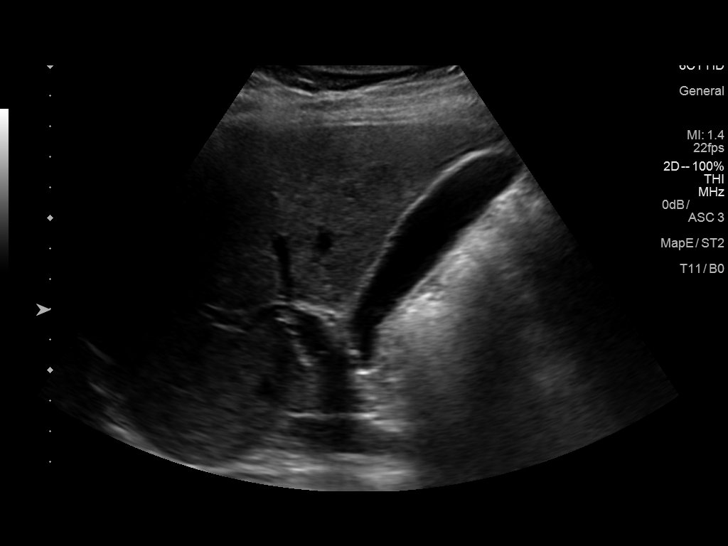
[im 7/83]
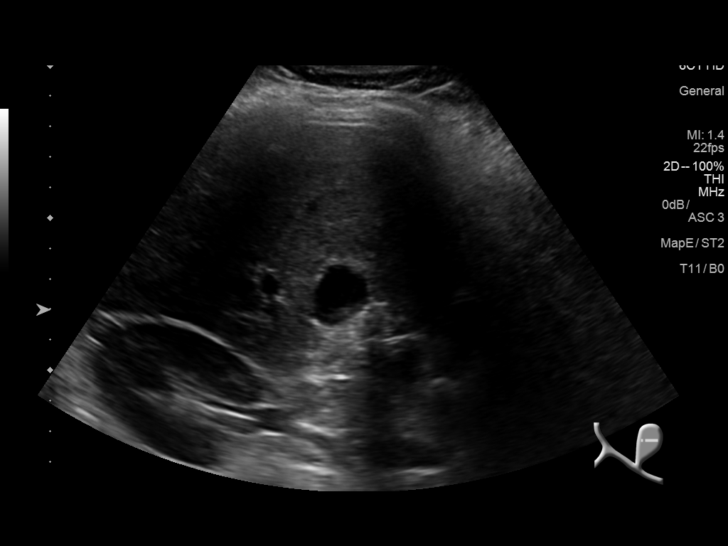
[im 14/83]
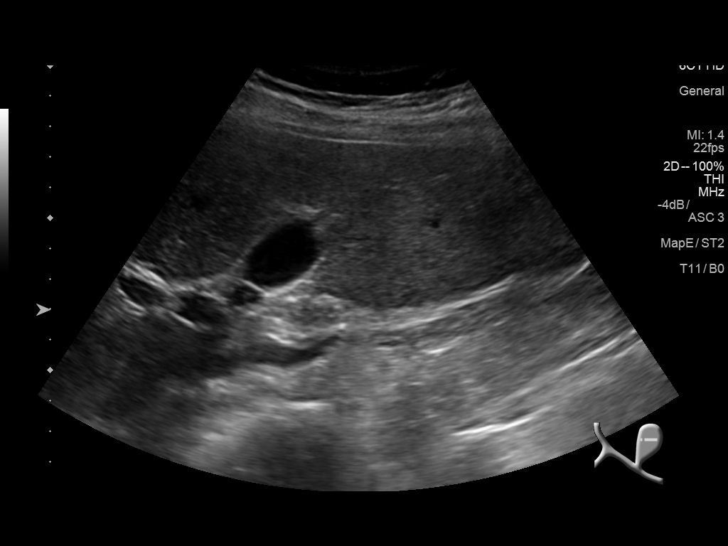
[im 21/83]
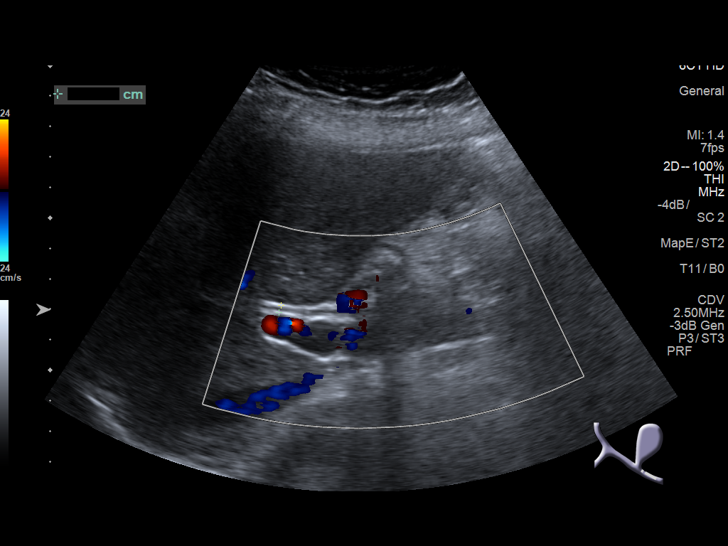
[im 28/83]
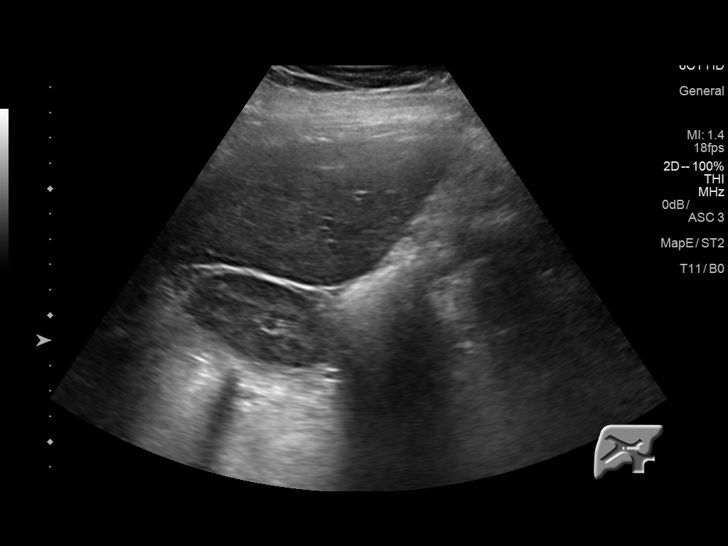
[im 31/83]
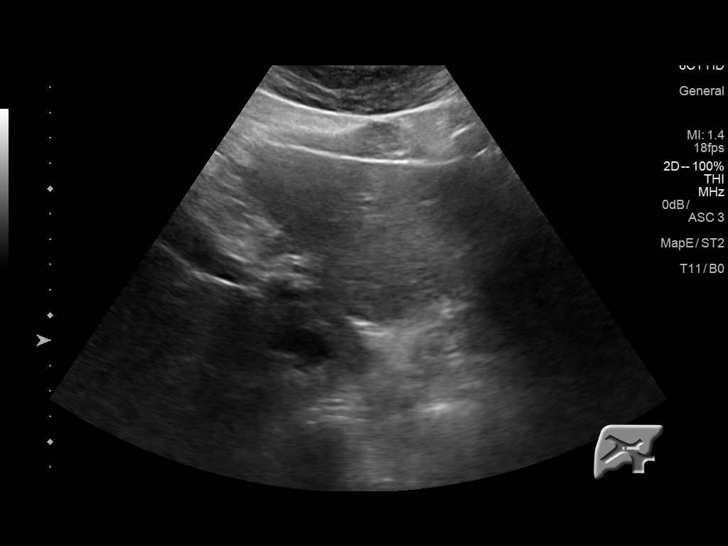
[im 38/83]
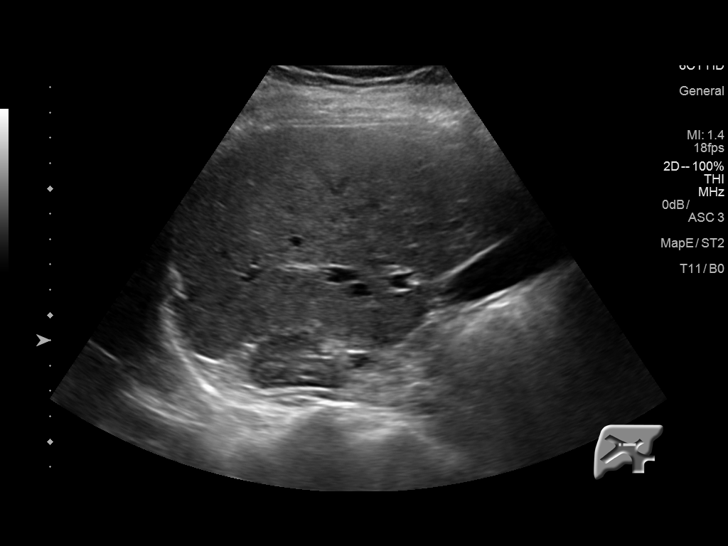
[im 45/83]
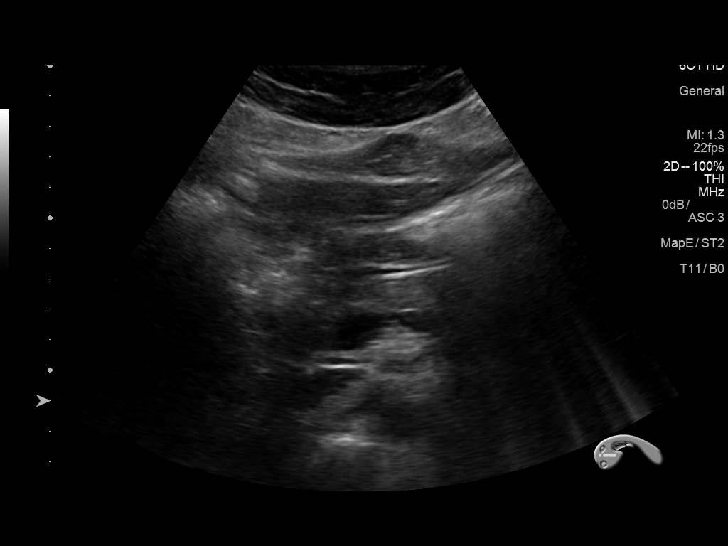
[im 52/83]
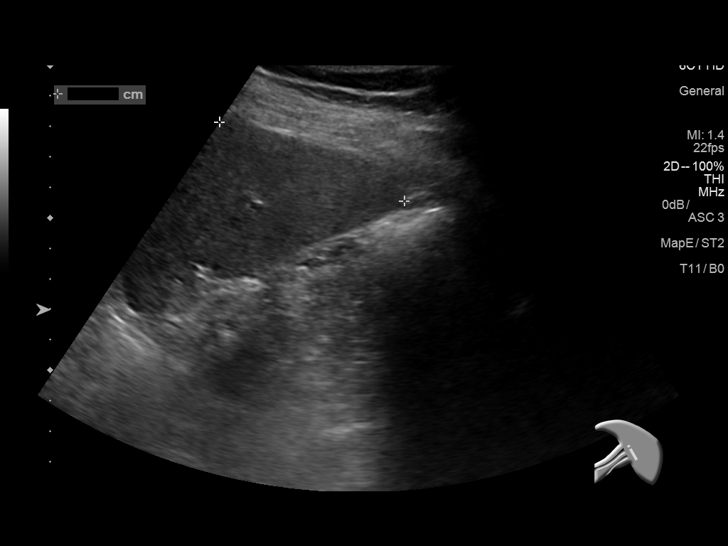
[im 55/83]
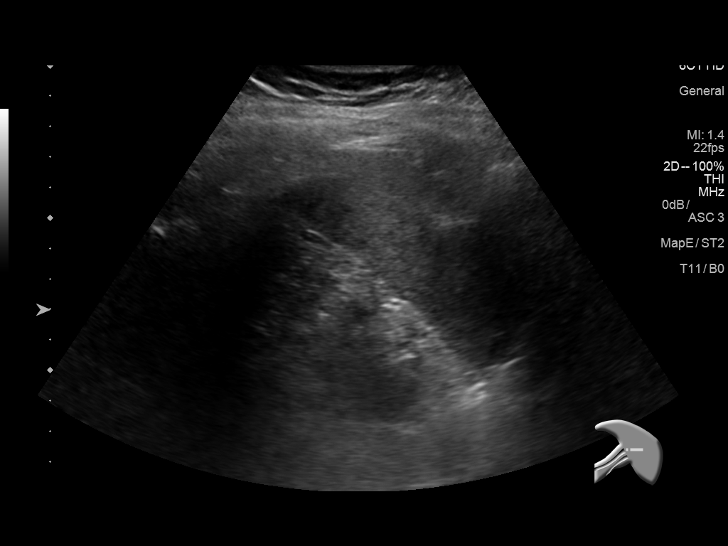
[im 62/83]
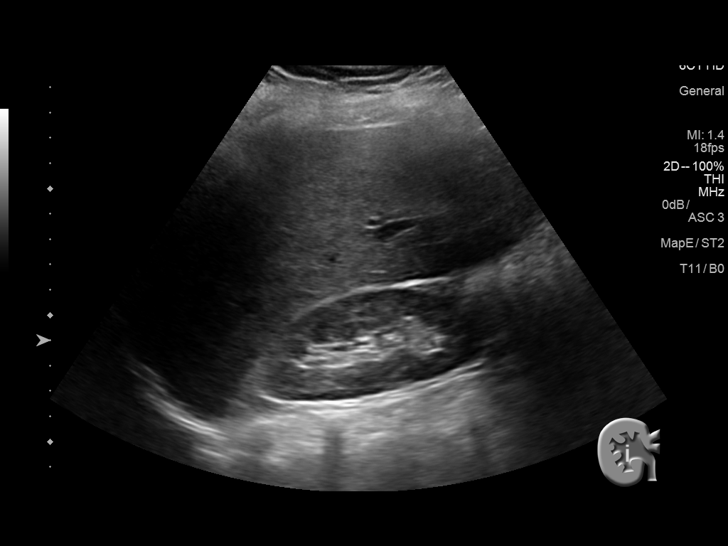
[im 69/83]
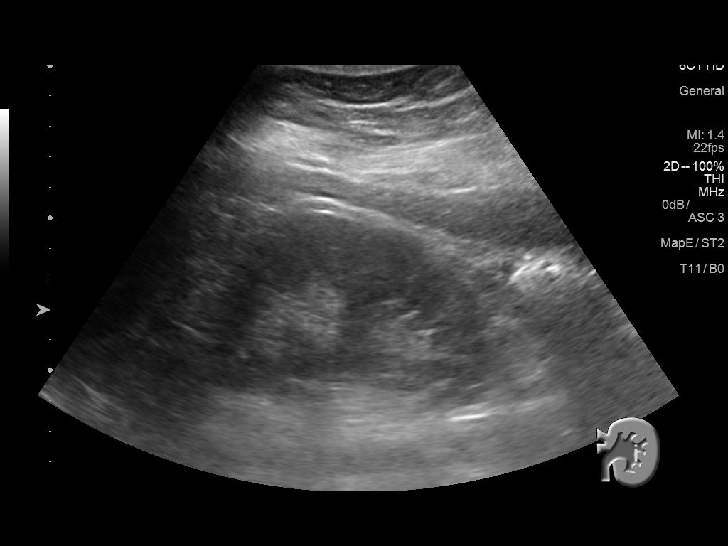
[im 76/83]
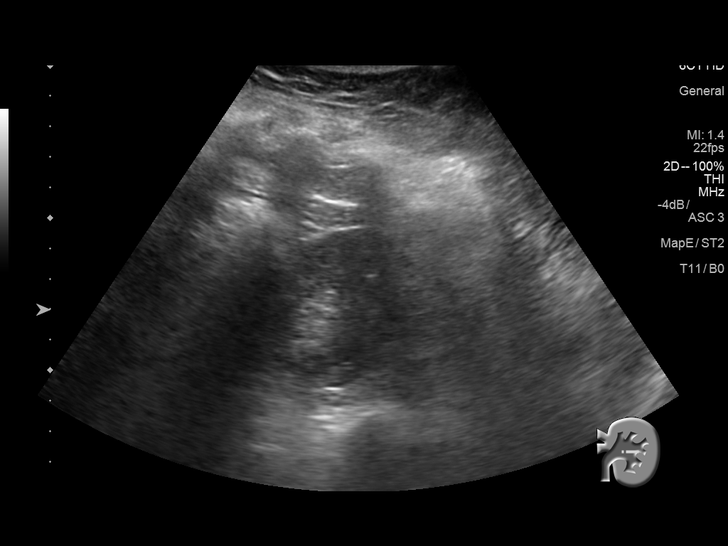
[im 83/83]
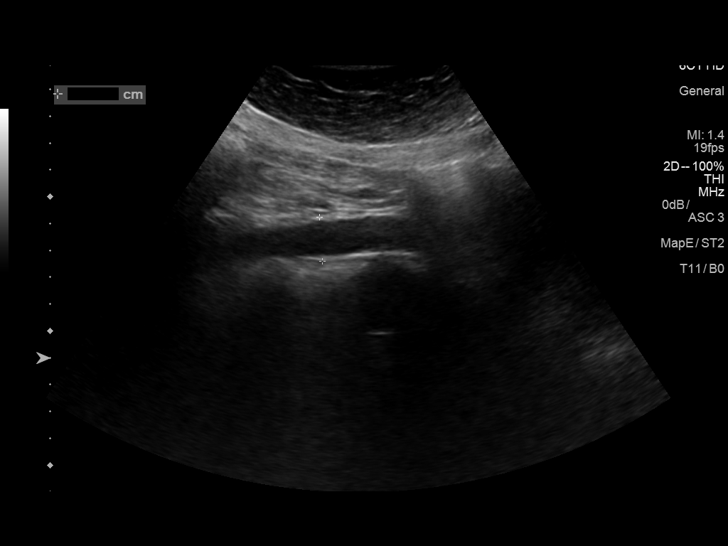

[14 of 25 positions shown; findings below may reference images not displayed]

FINDINGS: Gallbladder: No gallstones or wall thickening visualized. No
sonographic Murphy sign noted by sonographer.

Common bile duct: Diameter: 2 mm. Where visualized, no filling
defect.

Liver: No focal lesion identified. Within normal limits in
parenchymal echogenicity.

IVC: No abnormality visualized.

Pancreas: Visualized portion unremarkable.

Spleen: Size and appearance within normal limits.

Right Kidney: Length: 9 cm. Echogenicity within normal limits. No
mass or hydronephrosis visualized.

Left Kidney: Length: 9 cm. Echogenicity within normal limits. No
mass or hydronephrosis visualized.

Abdominal aorta: No aneurysm visualized.

Other findings: None.
IMPRESSION: Normal abdominal ultrasound.

## 2017-01-04 IMAGING — NM NM HEPATO W/GB/PHARM/[PERSON_NAME]
2 series · 8 of 8 positions shown · non-contrast
Comparison: None

CLINICAL DATA: Epigastric abdominal pain and nausea for 1 month

EXAM:
NUCLEAR MEDICINE HEPATOBILIARY IMAGING WITH GALLBLADDER EF
TECHNIQUE: Sequential images of the abdomen were obtained [DATE] minutes
following intravenous administration of radiopharmaceutical. After
slow intravenous infusion of 1.59 micrograms Cholecystokinin,
gallbladder ejection fraction was determined.
RADIOPHARMACEUTICALS:  5.3 mCi Gc-HHm Choletec IV

[he hepatobiliary · 3.43mm/px · 2 of 2 frames shown (1 of 2)]
[frame 1/2]
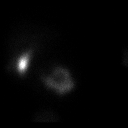
[frame 2/2]
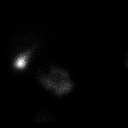

[he hepatobiliary · 3.43mm/px · 6 of 60 frames shown (2 of 2)]
[frame 6/60]
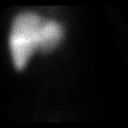
[frame 16/60]
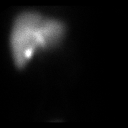
[frame 26/60]
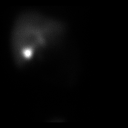
[frame 36/60]
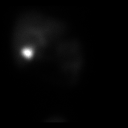
[frame 46/60]
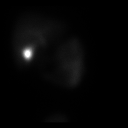
[frame 56/60]
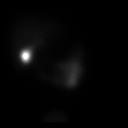

[8 of 8 positions shown; findings below may reference images not displayed]

FINDINGS: Normal tracer extraction from bloodstream indicating normal
hepatocellular function.

Normal excretion of tracer into biliary tree.

Gallbladder visualized at 15 min.

Small bowel visualized at 17 min.

No hepatic retention of tracer.

Subjectively normal emptying of tracer from gallbladder following
CCK administration.

Calculated gallbladder ejection fraction is 86%, normal.

Patient reported no symptoms following CCK administration.
IMPRESSION: Normal exam.

## 2017-11-06 ENCOUNTER — Other Ambulatory Visit: Payer: Self-pay | Admitting: Surgery

## 2017-12-03 NOTE — Pre-Procedure Instructions (Signed)
Nancy Stokes  12/03/2017      [id number] [address] ([address] [place] - [address] [place] ([place]) [place] [address] Phone: [phone] Fax: [phone]    Your procedure is scheduled on Wednesday November 6.  Report to [facility] Admitting at 6:30 A.M.  Call this number if you have problems the morning of surgery:  [phone]   Remember:  Do not eat or drink after midnight.  You may drink clear liquids until 5:30 .  Clear liquids allowed are:   Water, Juice (non-citric and without pulp), Black Coffee only and Gatorade    Take these medicines the morning of surgery with A SIP OF WATER: NONE  7 days prior to surgery STOP taking any Aspirin(unless otherwise instructed by your surgeon), Aleve, Naproxen, Ibuprofen, Motrin, Advil, Goody's, BC's, all herbal medications, fish oil, and all vitamins     Do not wear jewelry, make-up or nail polish.  Do not wear lotions, powders, or perfumes, or deodorant.  Do not shave 48 hours prior to surgery.  Men may shave face and neck.  Do not bring valuables to the hospital.  [facility] is not responsible for any belongings or valuables.  Contacts, dentures or bridgework may not be worn into surgery.  Leave your suitcase in the car.  After surgery it may be brought to your room.  For patients admitted to the hospital, discharge time will be determined by your treatment team.  Patients discharged the day of surgery will not be allowed to drive home.   Special instructions:    [facility]- Preparing For Surgery  Before surgery, you can play an important role. Because skin is not sterile, your skin needs to be as free of germs as possible. You can reduce the number of germs on your skin by washing with CHG (chlorahexidine gluconate) Soap before surgery.  CHG is an antiseptic cleaner which kills germs and bonds with the skin to continue killing germs even after washing.    Oral Hygiene is also  important to reduce your risk of infection.  Remember - BRUSH YOUR TEETH THE MORNING OF SURGERY WITH YOUR REGULAR TOOTHPASTE  Please do not use if you have an allergy to CHG or antibacterial soaps. If your skin becomes reddened/irritated stop using the CHG.  Do not shave (including legs and underarms) for at least 48 hours prior to first CHG shower. It is OK to shave your face.  Please follow these instructions carefully.   1. Shower the NIGHT BEFORE SURGERY and the MORNING OF SURGERY with CHG.   2. If you chose to wash your hair, wash your hair first as usual with your normal shampoo.  3. After you shampoo, rinse your hair and body thoroughly to remove the shampoo.  4. Use CHG as you would any other liquid soap. You can apply CHG directly to the skin and wash gently with a scrungie or a clean washcloth.   5. Apply the CHG Soap to your body ONLY FROM THE NECK DOWN.  Do not use on open wounds or open sores. Avoid contact with your eyes, ears, mouth and genitals (private parts). Wash Face and genitals (private parts)  with your normal soap.  6. Wash thoroughly, paying special attention to the area where your surgery will be performed.  7. Thoroughly rinse your body with warm water from the neck down.  8. DO NOT shower/wash with your normal soap after using and rinsing  off the CHG Soap.  9. Pat yourself dry with a CLEAN TOWEL.  10. Wear CLEAN PAJAMAS to bed the night before surgery, wear comfortable clothes the morning of surgery  11. Place CLEAN SHEETS on your bed the night of your first shower and DO NOT SLEEP WITH PETS.    Day of Surgery:  Do not apply any deodorants/lotions.  Please wear clean clothes to the hospital/surgery center.   Remember to brush your teeth WITH YOUR REGULAR TOOTHPASTE.    Please read over the following fact sheets that you were given. Coughing and Deep Breathing and Surgical Site Infection Prevention

## 2017-12-04 ENCOUNTER — Other Ambulatory Visit: Payer: Self-pay

## 2017-12-04 ENCOUNTER — Encounter (HOSPITAL_COMMUNITY)
Admission: RE | Admit: 2017-12-04 | Discharge: 2017-12-04 | Disposition: A | Discharge status: Home / Self Care | Payer: BC Managed Care – PPO | Source: Ambulatory Visit | Attending: Surgery | Admitting: Surgery

## 2017-12-04 ENCOUNTER — Encounter (HOSPITAL_COMMUNITY): Payer: Self-pay

## 2017-12-04 DIAGNOSIS — Z01812 Encounter for preprocedural laboratory examination: Secondary | ICD-10-CM | POA: Diagnosis present

## 2017-12-04 DIAGNOSIS — R221 Localized swelling, mass and lump, neck: Secondary | ICD-10-CM | POA: Insufficient documentation

## 2017-12-04 HISTORY — DX: Adverse effect of unspecified anesthetic, initial encounter: T41.45XA

## 2017-12-04 HISTORY — DX: Other complications of anesthesia, initial encounter: T88.59XA

## 2017-12-04 LAB — CBC
HEMATOCRIT: 44.1 % (ref 36.0–46.0)
HEMOGLOBIN: 14.5 g/dL (ref 12.0–15.0)
MCH: 30 pg (ref 26.0–34.0)
MCHC: 32.9 g/dL (ref 30.0–36.0)
MCV: 91.1 fL (ref 80.0–100.0)
Platelets: 304 10*3/uL (ref 150–400)
RBC: 4.84 MIL/uL (ref 3.87–5.11)
RDW: 12.6 % (ref 11.5–15.5)
WBC: 7.8 10*3/uL (ref 4.0–10.5)
nRBC: 0 % (ref 0.0–0.2)

## 2017-12-10 NOTE — Anesthesia Preprocedure Evaluation (Addendum)
Anesthesia Evaluation  Patient identified by MRN, date of birth, ID band Patient awake    Reviewed: Allergy & Precautions, H&P , NPO status , Patient's Chart, lab work & pertinent test results  Airway Mallampati: II  TM Distance: >3 FB Neck ROM: full    Dental no notable dental hx.    Pulmonary neg pulmonary ROS,    Pulmonary exam normal        Cardiovascular negative cardio ROS Normal cardiovascular exam     Neuro/Psych negative psych ROS   GI/Hepatic negative GI ROS, Neg liver ROS,   Endo/Other  negative endocrine ROS  Renal/GU negative Renal ROS     Musculoskeletal   Abdominal Normal abdominal exam  (+)   Peds  Hematology negative hematology ROS (+)   Anesthesia Other Findings   Reproductive/Obstetrics negative OB ROS                           Anesthesia Physical  Anesthesia Plan  ASA: II  Anesthesia Plan: General   Post-op Pain Management:    Induction: Intravenous  PONV Risk Score and Plan: 3 and Ondansetron, Treatment may vary due to age or medical condition, Dexamethasone and Midazolam  Airway Management Planned: Oral ETT and LMA  Additional Equipment:   Intra-op Plan:   Post-operative Plan: Extubation in OR  Informed Consent: I have reviewed the patients History and Physical, chart, labs and discussed the procedure including the risks, benefits and alternatives for the proposed anesthesia with the patient or authorized representative who has indicated his/her understanding and acceptance.     Plan Discussed with: CRNA, Anesthesiologist and Surgeon  Anesthesia Plan Comments: (  )        Anesthesia Quick Evaluation

## 2017-12-10 NOTE — H&P (Signed)
Nancy Stokes  Location: [facility] Patient #: [id number] DOB: 03/13/1990 Single / Language: Nancy Stokes / Race: White Female   History of Present Illness (Nancy Keithly A. Magnus Ivan MD;  The patient is a 27 year old female who presents with a complaint of Mass. This is a pleasant 27 year old female referred by Nancy Stokes for evaluation of a right neck mass. The patient reports that the mass and presents approximate year. She noticed it when she was having some mild discomfort. It is not changed in size. She has noticed no similar mass in the past. She has no fever or weight loss. She denies any discomfort in her oropharynx. She had an ultrasound of the mass which showed the small less than 1 cm mass posterior to the sternomastoid on the right side of the neck of uncertain etiology. It did not have characteristics of a lymph node. She is otherwise healthy without complaints.   Diagnostic Studies History (Nancy Massenburg, LPN;  Colonoscopy  never Mammogram  never Pap Smear  1-5 years ago  Allergies (Nancy Massenburg, LPN;  No Known Drug Allergies [ (Marked as Inactive) Allergies Reconciled  Meloxicam *ANALGESICS - ANTI-INFLAMMATORY*   Medication History (Nancy Massenburg, LPN;  Junel FE 0/45 (1-20MG -MCG Tablet, Oral) Active.  Social History Copy, LPN;  Alcohol use  Occasional alcohol use. Caffeine use  Carbonated beverages, Coffee, Tea. No drug use   Family History (Nancy Massenburg, LPN;  Alcohol Abuse  Father. Family history unknown  First Degree Relatives   Pregnancy / Birth History Copy,  Age at menarche  12 years. Gravida  2 Maternal age  88-20 Para  2 Regular periods   Other Problems (Nancy Massenburg, LPN;  No pertinent past medical history     Review of Systems (Nancy Massenburg LPN;  General Present- Night Sweats. Not Present- Appetite Loss, Chills, Fatigue, Fever, Weight Gain  and Weight Loss. Skin Not Present- Change in Wart/Mole, Dryness, Hives, Jaundice, New Lesions, Non-Healing Wounds, Rash and Ulcer. HEENT Not Present- Earache, Hearing Loss, Hoarseness, Nose Bleed, Oral Ulcers, Ringing in the Ears, Seasonal Allergies, Sinus Pain, Sore Throat, Visual Disturbances, Wears glasses/contact lenses and Yellow Eyes. Respiratory Not Present- Bloody sputum, Chronic Cough, Difficulty Breathing, Snoring and Wheezing. Female Genitourinary Not Present- Frequency, Nocturia, Painful Urination, Pelvic Pain and Urgency. Musculoskeletal Not Present- Back Pain, Joint Pain, Joint Stiffness, Muscle Pain, Muscle Weakness and Swelling of Extremities. Neurological Present- Headaches. Not Present- Decreased Memory, Fainting, Numbness, Seizures, Tingling, Tremor, Trouble walking and Weakness. Psychiatric Present- Anxiety. Not Present- Bipolar, Change in Sleep Pattern, Depression, Fearful and Frequent crying. Endocrine Not Present- Cold Intolerance, Excessive Hunger, Hair Changes, Heat Intolerance, Hot flashes and New Diabetes. Hematology Not Present- Blood Thinners, Easy Bruising, Excessive bleeding, Gland problems, HIV and Persistent Infections.  Vitals  Weight: 148 lb Height: 64in Body Surface Area: 1.72 m Body Mass Index: 25.4 kg/m  Temp.: 98.66F(Tympanic)  Pulse: 95 (Regular)  Resp.: 18 (Unlabored)  P.OX: 100% (Room air) BP: 110/76 (Sitting, Left Arm, Standard)     Physical Exam  The physical exam findings are as follows: Note:Generally, she is well appearance Lungs are clear bilaterally Cardiovascular is regular rate and rhythm On neck exam, there is a less than 1 cm small nodule in the posterior cervical chain posterior to the sternomastoid. There are no skin changes. It is nontender and mobile  I reviewed her ultrasound of the neck    Assessment & Plan  MASS OF RIGHT SIDE OF NECK (R22.1) Impression: This is  a right neck mass of uncertain etiology.  Although it does not have the full characteristics of an enlarged lymph node, this still may be lymphadenopathy. It could also represent a desmoid tumor. Surgical excision is recommended for histologic evaluation to rule out malignancy. She would also like to have the mass removed. I discussed the risk of surgery which include but is not limited to bleeding, infection, injury to surrounding structures, the need for further procedures, cardiopulmonary issues, postop recovery, etc. She understands and wishes to proceed with surgery

## 2017-12-11 ENCOUNTER — Ambulatory Visit (HOSPITAL_COMMUNITY): Payer: BC Managed Care – PPO | Admitting: Certified Registered"

## 2017-12-11 ENCOUNTER — Encounter (HOSPITAL_COMMUNITY): Payer: Self-pay | Admitting: Certified Registered"

## 2017-12-11 ENCOUNTER — Encounter (HOSPITAL_COMMUNITY)
Admission: RE | Disposition: A | Discharge status: Home / Self Care | Payer: Self-pay | Source: Ambulatory Visit | Attending: Surgery

## 2017-12-11 ENCOUNTER — Ambulatory Visit (HOSPITAL_COMMUNITY)
Admission: RE | Admit: 2017-12-11 | Discharge: 2017-12-11 | Disposition: A | Discharge status: Home / Self Care | Payer: BC Managed Care – PPO | Source: Ambulatory Visit | Attending: Surgery | Admitting: Surgery

## 2017-12-11 DIAGNOSIS — Z793 Long term (current) use of hormonal contraceptives: Secondary | ICD-10-CM | POA: Insufficient documentation

## 2017-12-11 DIAGNOSIS — R221 Localized swelling, mass and lump, neck: Secondary | ICD-10-CM | POA: Insufficient documentation

## 2017-12-11 HISTORY — PX: MASS EXCISION: SHX2000

## 2017-12-11 LAB — POCT PREGNANCY, URINE: PREG TEST UR: NEGATIVE

## 2017-12-11 SURGERY — EXCISION MASS
Anesthesia: General | Site: Neck | Laterality: Right

## 2017-12-11 MED ORDER — CHLORHEXIDINE GLUCONATE CLOTH 2 % EX PADS: 6.0000 | MEDICATED_PAD | Freq: Once | CUTANEOUS | Status: DC

## 2017-12-11 MED ORDER — DEXAMETHASONE SODIUM PHOSPHATE 10 MG/ML IJ SOLN
INTRAMUSCULAR | Status: DC | PRN
  Administered 2017-12-11: 4 mg via INTRAVENOUS

## 2017-12-11 MED ORDER — MIDAZOLAM HCL 2 MG/2ML IJ SOLN
INTRAMUSCULAR | Status: AC
  Filled 2017-12-11: qty 2

## 2017-12-11 MED ORDER — CEFAZOLIN SODIUM-DEXTROSE 2-4 GM/100ML-% IV SOLN
2.0000 g | INTRAVENOUS | Status: AC
  Administered 2017-12-11: 2 g via INTRAVENOUS
  Filled 2017-12-11: qty 100

## 2017-12-11 MED ORDER — PROPOFOL 10 MG/ML IV BOLUS
INTRAVENOUS | Status: AC
  Filled 2017-12-11: qty 20

## 2017-12-11 MED ORDER — EPHEDRINE SULFATE-NACL 50-0.9 MG/10ML-% IV SOSY
PREFILLED_SYRINGE | INTRAVENOUS | Status: DC | PRN
  Administered 2017-12-11: 15 mg via INTRAVENOUS

## 2017-12-11 MED ORDER — BUPIVACAINE-EPINEPHRINE (PF) 0.25% -1:200000 IJ SOLN
INTRAMUSCULAR | Status: AC
  Filled 2017-12-11: qty 30

## 2017-12-11 MED ORDER — LIDOCAINE 2% (20 MG/ML) 5 ML SYRINGE
INTRAMUSCULAR | Status: DC | PRN
  Administered 2017-12-11: 100 mg via INTRAVENOUS

## 2017-12-11 MED ORDER — TRAMADOL HCL 50 MG PO TABS: 50.0000 mg | ORAL_TABLET | Freq: Four times a day (QID) | ORAL | 1 refills | Status: AC | PRN

## 2017-12-11 MED ORDER — PROPOFOL 10 MG/ML IV BOLUS
INTRAVENOUS | Status: DC | PRN
  Administered 2017-12-11: 120 mg via INTRAVENOUS
  Administered 2017-12-11: 30 mg via INTRAVENOUS

## 2017-12-11 MED ORDER — MIDAZOLAM HCL 5 MG/5ML IJ SOLN
INTRAMUSCULAR | Status: DC | PRN
  Administered 2017-12-11: 2 mg via INTRAVENOUS

## 2017-12-11 MED ORDER — GABAPENTIN 300 MG PO CAPS
300.0000 mg | ORAL_CAPSULE | ORAL | Status: AC
  Administered 2017-12-11: 300 mg via ORAL
  Filled 2017-12-11: qty 1

## 2017-12-11 MED ORDER — ONDANSETRON HCL 4 MG/2ML IJ SOLN
INTRAMUSCULAR | Status: DC | PRN
  Administered 2017-12-11: 4 mg via INTRAVENOUS

## 2017-12-11 MED ORDER — BUPIVACAINE-EPINEPHRINE 0.25% -1:200000 IJ SOLN
INTRAMUSCULAR | Status: DC | PRN
  Administered 2017-12-11: 6 mL

## 2017-12-11 MED ORDER — EPHEDRINE 5 MG/ML INJ
INTRAVENOUS | Status: AC
  Filled 2017-12-11: qty 10

## 2017-12-11 MED ORDER — FENTANYL CITRATE (PF) 250 MCG/5ML IJ SOLN
INTRAMUSCULAR | Status: AC
  Filled 2017-12-11: qty 5

## 2017-12-11 MED ORDER — DEXAMETHASONE SODIUM PHOSPHATE 10 MG/ML IJ SOLN
INTRAMUSCULAR | Status: AC
  Filled 2017-12-11: qty 1

## 2017-12-11 MED ORDER — ONDANSETRON HCL 4 MG/2ML IJ SOLN
INTRAMUSCULAR | Status: AC
  Filled 2017-12-11: qty 2

## 2017-12-11 MED ORDER — ACETAMINOPHEN 500 MG PO TABS
1000.0000 mg | ORAL_TABLET | ORAL | Status: AC
  Administered 2017-12-11: 1000 mg via ORAL
  Filled 2017-12-11: qty 2

## 2017-12-11 MED ORDER — 0.9 % SODIUM CHLORIDE (POUR BTL) OPTIME
TOPICAL | Status: DC | PRN
  Administered 2017-12-11: 1000 mL

## 2017-12-11 MED ORDER — FENTANYL CITRATE (PF) 100 MCG/2ML IJ SOLN
INTRAMUSCULAR | Status: DC | PRN
  Administered 2017-12-11: 100 ug via INTRAVENOUS

## 2017-12-11 MED ORDER — LACTATED RINGERS IV SOLN
INTRAVENOUS | Status: DC | PRN
  Administered 2017-12-11: 08:00:00 via INTRAVENOUS

## 2017-12-11 SURGICAL SUPPLY — 42 items
ADH SKN CLS APL DERMABOND .7 (GAUZE/BANDAGES/DRESSINGS) ×1
BLADE SURG 10 STRL SS (BLADE) ×1 IMPLANT
BLADE SURG 15 STRL LF DISP TIS (BLADE) ×1 IMPLANT
BLADE SURG 15 STRL SS (BLADE) ×3
CANISTER SUCT 3000ML PPV (MISCELLANEOUS) IMPLANT
COVER SURGICAL LIGHT HANDLE (MISCELLANEOUS) ×3 IMPLANT
COVER WAND RF STERILE (DRAPES) ×1 IMPLANT
DERMABOND ADVANCED (GAUZE/BANDAGES/DRESSINGS) ×2
DERMABOND ADVANCED .7 DNX12 (GAUZE/BANDAGES/DRESSINGS) ×1 IMPLANT
DRAPE LAPAROSCOPIC ABDOMINAL (DRAPES) IMPLANT
DRAPE LAPAROTOMY 100X72 PEDS (DRAPES) ×2 IMPLANT
DRAPE UTILITY XL STRL (DRAPES) ×3 IMPLANT
DRSG TEGADERM 4X4.75 (GAUZE/BANDAGES/DRESSINGS) ×1 IMPLANT
ELECT CAUTERY BLADE 6.4 (BLADE) ×3 IMPLANT
ELECT REM PT RETURN 9FT ADLT (ELECTROSURGICAL) ×3
ELECTRODE REM PT RTRN 9FT ADLT (ELECTROSURGICAL) ×1 IMPLANT
GAUZE SPONGE 4X4 12PLY STRL (GAUZE/BANDAGES/DRESSINGS) ×1 IMPLANT
GLOVE SURG SIGNA 7.5 PF LTX (GLOVE) ×3 IMPLANT
GOWN STRL REUS W/ TWL LRG LVL3 (GOWN DISPOSABLE) ×1 IMPLANT
GOWN STRL REUS W/ TWL XL LVL3 (GOWN DISPOSABLE) ×1 IMPLANT
GOWN STRL REUS W/TWL LRG LVL3 (GOWN DISPOSABLE)
GOWN STRL REUS W/TWL XL LVL3 (GOWN DISPOSABLE) ×6
KIT BASIN OR (CUSTOM PROCEDURE TRAY) ×3 IMPLANT
KIT TURNOVER KIT B (KITS) ×3 IMPLANT
NDL HYPO 25GX1X1/2 BEV (NEEDLE) ×1 IMPLANT
NEEDLE HYPO 25GX1X1/2 BEV (NEEDLE) ×3 IMPLANT
NS IRRIG 1000ML POUR BTL (IV SOLUTION) ×3 IMPLANT
PACK SURGICAL SETUP 50X90 (CUSTOM PROCEDURE TRAY) ×3 IMPLANT
PAD ARMBOARD 7.5X6 YLW CONV (MISCELLANEOUS) ×3 IMPLANT
PENCIL BUTTON HOLSTER BLD 10FT (ELECTRODE) ×3 IMPLANT
SPECIMEN JAR SMALL (MISCELLANEOUS) ×3 IMPLANT
SPONGE LAP 18X18 X RAY DECT (DISPOSABLE) ×3 IMPLANT
SUT MNCRL AB 4-0 PS2 18 (SUTURE) ×3 IMPLANT
SUT VIC AB 3-0 SH 27 (SUTURE) ×3
SUT VIC AB 3-0 SH 27XBRD (SUTURE) ×1 IMPLANT
SYR BULB 3OZ (MISCELLANEOUS) ×3 IMPLANT
SYR CONTROL 10ML LL (SYRINGE) ×3 IMPLANT
TOWEL OR 17X24 6PK STRL BLUE (TOWEL DISPOSABLE) ×3 IMPLANT
TOWEL OR 17X26 10 PK STRL BLUE (TOWEL DISPOSABLE) ×1 IMPLANT
TUBE CONNECTING 12'X1/4 (SUCTIONS)
TUBE CONNECTING 12X1/4 (SUCTIONS) IMPLANT
YANKAUER SUCT BULB TIP NO VENT (SUCTIONS) IMPLANT

## 2017-12-11 NOTE — Anesthesia Postprocedure Evaluation (Signed)
Anesthesia Post Note  Patient: Psychologist, educational  Procedure(s) Performed: EXCISION RIGHT NECK MASS (Right Neck)     Patient location during evaluation: PACU Anesthesia Type: General Level of consciousness: awake and alert Pain management: pain level controlled Vital Signs Assessment: post-procedure vital signs reviewed and stable Respiratory status: spontaneous breathing, nonlabored ventilation, respiratory function stable and patient connected to nasal cannula oxygen Cardiovascular status: blood pressure returned to baseline and stable Postop Assessment: no apparent nausea or vomiting Anesthetic complications: no    Last Vitals:  Vitals:   12/11/17 0924 12/11/17 0932  BP: 121/85 126/78  Pulse: (!) 109 97  Resp: (!) 24 20  Temp: (!) 36.3 C   SpO2: 98% 100%    Last Pain:  Vitals:   12/11/17 0932  TempSrc:   PainSc: 0-No pain                 Jahari Wiginton

## 2017-12-11 NOTE — Op Note (Signed)
EXCISION RIGHT NECK MASS ERAS PATHWAY  Procedure Note  Nancy Stokes 12/11/2017   Pre-op Diagnosis: right neck mass     Post-op Diagnosis: right neck mass  Procedure(s): EXCISION RIGHT NECK MASS (7 mm)  ERAS PATHWAY  Surgeon(s): Abigail Miyamoto, MD  Anesthesia: Choice  Staff:  Circulator: Sofie Rower, RN Scrub Person: Dennie Maizes Circulator Assistant: Jolinda Croak, RN; Thera Flake, RN  Estimated Blood Loss: Minimal               Specimens: sent to path  Indications: This is a 27 year old female presents with a small posterior right neck mass.  It is been present for approximately a year.  Ultrasound shows a solid mass but could not determine whether it was a lymph node or some other abnormality.  Secondary to her discomfort in the location of the mass, decision was made to proceed with excision  Procedure: The patient was brought to the operating room and identified as the correct patient.  She was placed supine on the operating table and general anesthesia was induced.  Her neck was then prepped and draped on the right side in the usual sterile fashion.  I anesthetized the skin over the palpable posterior mass with lidocaine.  I then made an incision with a scalpel.  I dissected down to the platysma.  The mass was deep to this.  I had to go through the platysma with the cautery.  I then dissected down to the cervical fat posterior to the sternomastoid.  I was then able to identify the mass and grasp it.  It was a firm mass which was approximately 7 mm in size and appeared consistent with a possible lymph node.  I excised it and send it to pathology for evaluation.  I again palpated the neck through the incision and found no other masses.  Hemostasis appeared to be achieved.  I anesthetized the incision further with Marcaine.  I then closed the subcutaneous tissue with interrupted 3-0 Vicryl sutures and closed the skin with a running 4-0 Monocryl.  Dermabond was  then applied.  The patient tolerated the procedure well.  All the counts were correct at the end of the procedure.  The patient was then extubated in the operating room and taken in a stable condition to the recovery room.          Elridge Stemm A   Date: 12/11/2017  Time: 9:11 AM

## 2017-12-11 NOTE — Interval H&P Note (Signed)
History and Physical Interval Note:no change in H and P  12/11/2017 8:00 AM  Nancy Stokes  has presented today for surgery, with the diagnosis of right neck mass  The various methods of treatment have been discussed with the patient and family. After consideration of risks, benefits and other options for treatment, the patient has consented to  Procedure(s): EXCISION RIGHT NECK MASS ERAS PATHWAY (Right) as a surgical intervention .  The patient's history has been reviewed, patient examined, no change in status, stable for surgery.  I have reviewed the patient's chart and labs.  Questions were answered to the patient's satisfaction.     [name]

## 2017-12-11 NOTE — Anesthesia Procedure Notes (Signed)
Procedure Name: LMA Insertion Date/Time: 12/11/2017 8:42 AM Performed by: Shireen Quan, CRNA Pre-anesthesia Checklist: Patient identified, Emergency Drugs available, Suction available and Patient being monitored Patient Re-evaluated:Patient Re-evaluated prior to induction Oxygen Delivery Method: Circle System Utilized Preoxygenation: Pre-oxygenation with 100% oxygen Induction Type: IV induction Ventilation: Mask ventilation without difficulty LMA: LMA inserted LMA Size: 4.0 Number of attempts: 1 Placement Confirmation: positive ETCO2 Tube secured with: Tape Dental Injury: Teeth and Oropharynx as per pre-operative assessment

## 2017-12-11 NOTE — Discharge Instructions (Signed)
Ok to shower tomorrow  Ice pack, tylenol, ibuprofen also for pain  No vigorous activity for 1 week           To whom it may concern:  Nancy Stokes is recovering from surgery performed on December 11, 2017.  Please excuse her absence. She may return to work on Monday December 16, 2017 to full activity without limits.  Sincerely,  Shelly Rubenstein MD [facility] surgery

## 2017-12-11 NOTE — Transfer of Care (Signed)
Immediate Anesthesia Transfer of Care Note  Patient: Hospital doctor F Lanni  Procedure(s) Performed: EXCISION RIGHT NECK MASS (Right Neck)  Patient Location: PACU  Anesthesia Type:General  Level of Consciousness: awake, oriented and patient cooperative  Airway & Oxygen Therapy: Patient Spontanous Breathing and Patient connected to nasal cannula oxygen  Post-op Assessment: Report given to RN, Post -op Vital signs reviewed and stable and Patient moving all extremities  Post vital signs: Reviewed and stable  Last Vitals:  Vitals Value Taken Time  BP 135/85 12/11/2017  9:15 AM  Temp    Pulse 122 12/11/2017  9:16 AM  Resp 23 12/11/2017  9:16 AM  SpO2 99 % 12/11/2017  9:16 AM  Vitals shown include unvalidated device data.  Last Pain:  Vitals:   12/11/17 0651  TempSrc:   PainSc: 0-No pain         Complications: No apparent anesthesia complications

## 2017-12-12 ENCOUNTER — Encounter (HOSPITAL_COMMUNITY): Payer: Self-pay | Admitting: Surgery

## 2018-07-28 ENCOUNTER — Other Ambulatory Visit: Payer: Self-pay | Admitting: *Deleted

## 2018-07-28 DIAGNOSIS — Z20822 Contact with and (suspected) exposure to covid-19: Secondary | ICD-10-CM

## 2018-07-31 NOTE — Addendum Note (Signed)
Addended by: Brigitte Pulse on: 07/31/2018 04:28 PM   Modules accepted: Orders

## 2020-08-22 ENCOUNTER — Encounter (HOSPITAL_BASED_OUTPATIENT_CLINIC_OR_DEPARTMENT_OTHER): Payer: Self-pay | Admitting: *Deleted

## 2020-08-22 ENCOUNTER — Emergency Department (HOSPITAL_BASED_OUTPATIENT_CLINIC_OR_DEPARTMENT_OTHER)
Admission: EM | Admit: 2020-08-22 | Discharge: 2020-08-22 | Disposition: A | Discharge status: Home / Self Care | Payer: BC Managed Care – PPO | Attending: Emergency Medicine | Admitting: Emergency Medicine

## 2020-08-22 ENCOUNTER — Other Ambulatory Visit: Payer: Self-pay

## 2020-08-22 DIAGNOSIS — U071 COVID-19: Secondary | ICD-10-CM | POA: Diagnosis not present

## 2020-08-22 DIAGNOSIS — R0981 Nasal congestion: Secondary | ICD-10-CM | POA: Diagnosis present

## 2020-08-22 LAB — RESP PANEL BY RT-PCR (FLU A&B, COVID) ARPGX2
Influenza A by PCR: NEGATIVE
Influenza B by PCR: NEGATIVE
SARS Coronavirus 2 by RT PCR: POSITIVE — AB

## 2020-08-22 NOTE — ED Triage Notes (Signed)
Pt sent home from work due to runny nose. UC would not see pt due to having a "symptom" of Covid. Needs Covid test and work note.

## 2020-08-22 NOTE — ED Provider Notes (Signed)
[facility] EMERGENCY DEPT Provider Note   CSN: [id number] Arrival date & time: 08/22/20  1547     History Chief Complaint  Patient presents with   Nasal Congestion    Nancy Stokes is a 30 y.o. female.  Patient is a 30 year old female with no significant past medical history other than anemia presenting with complaints of nasal congestion.  The nasal congestion has been present for 2 days but her work reported that she had to come to the emergency room and be tested today.  She tried to go to urgent care but they refused to see her because she was having potential COVID-like symptoms.  Other than nasal congestion she has no other complaints.  No fever, cough, chest pain, shortness of breath.  No nausea, vomiting or abdominal pain.  She has no known sick contacts that she is aware of.  The history is provided by the patient.      Past Medical History:  Diagnosis Date   Anemia    when pregnant   Complication of anesthesia    ringing in the ears, blood pressure dropped,"felt like i would pass out" with epidural   No pertinent past medical history    PID (acute pelvic inflammatory disease)     Patient Active Problem List   Diagnosis Date Noted   Vaginal delivery 10/19/2013   Migraine, unspecified, without mention of intractable migraine without mention of status migrainosus 10/14/2013   History of HPV infection 10/14/2013    Past Surgical History:  Procedure Laterality Date   LAPAROSCOPY     LEEP     removed part of cervix with only local anesthesia   MASS EXCISION Right 12/11/2017   Procedure: EXCISION RIGHT NECK MASS;  Surgeon: Abigail Miyamoto, MD;  Location: [facility] OR;  Service: General;  Laterality: Right;     OB History     Gravida  2   Para  2   Term  2   Preterm  0   AB  0   Living  2      SAB  0   IAB  0   Ectopic  0   Multiple  0   Live Births  2           Family History  Problem Relation Age of Onset   Cancer Paternal  Aunt    Heart disease Paternal Grandfather    Stroke Paternal Grandfather     Social History   Tobacco Use   Smoking status: Never   Smokeless tobacco: Never  Vaping Use   Vaping Use: Never used  Substance Use Topics   Alcohol use: Yes    Comment: occasionally    Drug use: No    Home Medications Prior to Admission medications   Medication Sig Start Date End Date Taking? Authorizing Provider  JUNEL FE 1/20 1-20 MG-MCG tablet Take 1 tablet by mouth every evening.  10/05/17  Yes [provider]  traMADol (ULTRAM) 50 MG tablet Take 1-2 tablets (50-100 mg total) by mouth every 6 (six) hours as needed for moderate pain. 12/11/17   Abigail Miyamoto, MD    Allergies    Meloxicam  Review of Systems   Review of Systems  All other systems reviewed and are negative.  Physical Exam Updated Vital Signs BP 124/88 (BP Location: Right Arm)   Pulse 83   Temp 98 F (36.7 C) (Oral)   Resp 16   Ht 5' (1.524 m)   Wt 72.6 kg  SpO2 100%   BMI 31.25 kg/m   Physical Exam Vitals and nursing note reviewed.  Constitutional:      General: She is not in acute distress.    Appearance: She is well-developed.  HENT:     Head: Normocephalic and atraumatic.     Right Ear: Tympanic membrane normal.     Left Ear: Tympanic membrane normal.     Nose: Nose normal.     Mouth/Throat:     Mouth: Mucous membranes are moist.  Eyes:     Pupils: Pupils are equal, round, and reactive to light.  Cardiovascular:     Rate and Rhythm: Normal rate and regular rhythm.     Heart sounds: Normal heart sounds. No murmur heard.   No friction rub.  Pulmonary:     Effort: Pulmonary effort is normal.     Breath sounds: Normal breath sounds. No wheezing or rales.  Musculoskeletal:        General: No tenderness. Normal range of motion.     Comments: No edema  Skin:    General: Skin is warm and dry.     Findings: No rash.  Neurological:     Mental Status: She is alert and oriented to person, place,  and time.     Cranial Nerves: No cranial nerve deficit.  Psychiatric:        Behavior: Behavior normal.    ED Results / Procedures / Treatments   Labs (all labs ordered are listed, but only abnormal results are displayed) Labs Reviewed  RESP PANEL BY RT-PCR (FLU A&B, COVID) [id number]    EKG None  Radiology No results found.  Procedures Procedures   Medications Ordered in ED Medications - No data to display  ED Course  I have reviewed the triage vital signs and the nursing notes.  Pertinent labs & imaging results that were available during my care of the patient were reviewed by me and considered in my medical decision making (see chart for details).    MDM Rules/Calculators/A&P                          Patient is a 30 year old female presenting today with complaint of nasal congestion and needing a COVID test and work note.  Patient has no other complaints.  She is well-appearing on exam with normal physical.  COVID test sent patient discharged home Final Clinical Impression(s) / ED Diagnoses Final diagnoses:  Nasal congestion    Rx / DC Orders ED Discharge Orders     None        Gwyneth Sprout, MD 08/22/20 1631

## 2021-12-24 ENCOUNTER — Emergency Department (HOSPITAL_BASED_OUTPATIENT_CLINIC_OR_DEPARTMENT_OTHER)
Admission: EM | Admit: 2021-12-24 | Discharge: 2021-12-24 | Disposition: A | Discharge status: Home / Self Care | Payer: BC Managed Care – PPO | Attending: Emergency Medicine | Admitting: Emergency Medicine

## 2021-12-24 ENCOUNTER — Encounter (HOSPITAL_BASED_OUTPATIENT_CLINIC_OR_DEPARTMENT_OTHER): Payer: Self-pay

## 2021-12-24 ENCOUNTER — Emergency Department (HOSPITAL_BASED_OUTPATIENT_CLINIC_OR_DEPARTMENT_OTHER): Payer: BC Managed Care – PPO

## 2021-12-24 ENCOUNTER — Other Ambulatory Visit: Payer: Self-pay

## 2021-12-24 DIAGNOSIS — M545 Low back pain, unspecified: Secondary | ICD-10-CM | POA: Diagnosis not present

## 2021-12-24 DIAGNOSIS — R109 Unspecified abdominal pain: Secondary | ICD-10-CM | POA: Diagnosis not present

## 2021-12-24 DIAGNOSIS — R300 Dysuria: Secondary | ICD-10-CM | POA: Diagnosis not present

## 2021-12-24 LAB — URINALYSIS, ROUTINE W REFLEX MICROSCOPIC
Bilirubin Urine: NEGATIVE
Glucose, UA: NEGATIVE mg/dL
Ketones, ur: NEGATIVE mg/dL
Leukocytes,Ua: NEGATIVE
Nitrite: NEGATIVE
Protein, ur: NEGATIVE mg/dL
Specific Gravity, Urine: 1.008 (ref 1.005–1.030)
pH: 6 (ref 5.0–8.0)

## 2021-12-24 LAB — CBC
HCT: 41.3 % (ref 36.0–46.0)
Hemoglobin: 14 g/dL (ref 12.0–15.0)
MCH: 30.8 pg (ref 26.0–34.0)
MCHC: 33.9 g/dL (ref 30.0–36.0)
MCV: 91 fL (ref 80.0–100.0)
Platelets: 220 10*3/uL (ref 150–400)
RBC: 4.54 MIL/uL (ref 3.87–5.11)
RDW: 13.2 % (ref 11.5–15.5)
WBC: 4.4 10*3/uL (ref 4.0–10.5)
nRBC: 0 % (ref 0.0–0.2)

## 2021-12-24 LAB — BASIC METABOLIC PANEL
Anion gap: 8 (ref 5–15)
BUN: 9 mg/dL (ref 6–20)
CO2: 28 mmol/L (ref 22–32)
Calcium: 9.2 mg/dL (ref 8.9–10.3)
Chloride: 102 mmol/L (ref 98–111)
Creatinine, Ser: 0.68 mg/dL (ref 0.44–1.00)
GFR, Estimated: >60 mL/min (ref >60)
Glucose, Bld: 82 mg/dL (ref 70–99)
Potassium: 3.7 mmol/L (ref 3.5–5.1)
Sodium: 138 mmol/L (ref 135–145)

## 2021-12-24 LAB — PREGNANCY, URINE: Preg Test, Ur: NEGATIVE

## 2021-12-24 MED ORDER — FENTANYL CITRATE PF 50 MCG/ML IJ SOSY
50.0000 ug | PREFILLED_SYRINGE | Freq: Once | INTRAMUSCULAR | Status: AC
  Administered 2021-12-24: 50 ug via INTRAVENOUS
  Filled 2021-12-24: qty 1

## 2021-12-24 MED ORDER — HYDROMORPHONE HCL 1 MG/ML IJ SOLN
1.0000 mg | Freq: Once | INTRAMUSCULAR | Status: AC
  Administered 2021-12-24: 1 mg via INTRAVENOUS
  Filled 2021-12-24: qty 1

## 2021-12-24 MED ORDER — NAPROXEN 500 MG PO TABS: 500.0000 mg | ORAL_TABLET | Freq: Two times a day (BID) | ORAL | 0 refills | Status: AC

## 2021-12-24 MED ORDER — ONDANSETRON 4 MG PO TBDP: 4.0000 mg | ORAL_TABLET | Freq: Three times a day (TID) | ORAL | 0 refills | Status: AC | PRN

## 2021-12-24 MED ORDER — ONDANSETRON HCL 4 MG/2ML IJ SOLN
4.0000 mg | Freq: Once | INTRAMUSCULAR | Status: AC
  Administered 2021-12-24: 4 mg via INTRAVENOUS
  Filled 2021-12-24: qty 2

## 2021-12-24 MED ORDER — KETOROLAC TROMETHAMINE 15 MG/ML IJ SOLN
15.0000 mg | Freq: Once | INTRAMUSCULAR | Status: AC
  Administered 2021-12-24: 15 mg via INTRAVENOUS
  Filled 2021-12-24: qty 1

## 2021-12-24 MED ORDER — SODIUM CHLORIDE 0.9 % IV BOLUS
1000.0000 mL | Freq: Once | INTRAVENOUS | Status: AC
  Administered 2021-12-24: 1000 mL via INTRAVENOUS

## 2021-12-24 NOTE — ED Provider Notes (Signed)
[facility] EMERGENCY DEPT Provider Note   CSN: [id number] Arrival date & time: 12/24/21  1421     History {Add pertinent medical, surgical, social history, OB history to HPI:1} Chief Complaint  Patient presents with   Back Pain    Nancy Stokes is a 31 y.o. female.  Patient with low back pain intermittent for the past 1 week.  Pain is fairly constant but he became more severe 2 days ago.  Has had some frequency and hematuria and urgency.  She saw her PCP was given a shot of antibiotics for questionable UTI.  Comes in today with worsening low back pain in the middle of her back with no radiation down her legs.  No focal weakness, numbness or tingling.  No bowel or bladder incontinence.  No known fever.  No vomiting.  No chest pain or shortness of breath. Concern for possible kidney stone.  No previous abdominal surgeries.  The history is provided by the patient.  Back Pain Associated symptoms: abdominal pain and dysuria   Associated symptoms: no chest pain, no fever and no weakness        Home Medications Prior to Admission medications   Medication Sig Start Date End Date Taking? Authorizing Provider  JUNEL FE 1/20 1-20 MG-MCG tablet Take 1 tablet by mouth every evening.  10/05/17   [provider]  traMADol (ULTRAM) 50 MG tablet Take 1-2 tablets (50-100 mg total) by mouth every 6 (six) hours as needed for moderate pain. 12/11/17   Abigail Miyamoto, MD      Allergies    Meloxicam    Review of Systems   Review of Systems  Constitutional:  Negative for activity change, appetite change and fever.  HENT:  Negative for congestion and rhinorrhea.   Cardiovascular:  Negative for chest pain.  Gastrointestinal:  Positive for abdominal pain. Negative for nausea and vomiting.  Genitourinary:  Positive for dysuria. Negative for hematuria, vaginal bleeding and vaginal discharge.  Musculoskeletal:  Positive for arthralgias, back pain and myalgias.  Skin:   Negative for rash.  Neurological:  Negative for weakness.    all other systems are negative except as noted in the HPI and PMH.   Physical Exam Updated Vital Signs BP (!) 134/94 (BP Location: Right Arm)   Pulse 98   Temp 97.7 F (36.5 C) (Oral)   Resp 20   Ht 5' (1.524 m)   Wt 72.6 kg   SpO2 98%   BMI 31.26 kg/m  Physical Exam Vitals and nursing note reviewed.  Constitutional:      General: She is not in acute distress.    Appearance: She is well-developed. She is not ill-appearing.  HENT:     Head: Normocephalic and atraumatic.     Mouth/Throat:     Pharynx: No oropharyngeal exudate.  Eyes:     Conjunctiva/sclera: Conjunctivae normal.     Pupils: Pupils are equal, round, and reactive to light.  Neck:     Comments: No meningismus. Cardiovascular:     Rate and Rhythm: Normal rate and regular rhythm.     Heart sounds: Normal heart sounds. No murmur heard. Pulmonary:     Effort: Pulmonary effort is normal. No respiratory distress.     Breath sounds: Normal breath sounds.  Abdominal:     Palpations: Abdomen is soft.     Tenderness: There is no abdominal tenderness. There is no guarding or rebound.  Musculoskeletal:        General: Tenderness present. Normal range  of motion.     Cervical back: Normal range of motion and neck supple.     Comments: No midline tenderness  Skin:    General: Skin is warm.  Neurological:     Mental Status: She is alert and oriented to person, place, and time.     Cranial Nerves: No cranial nerve deficit.     Motor: No abnormal muscle tone.     Coordination: Coordination normal.     Comments:  5/5 strength throughout. CN 2-12 intact.Equal grip strength.   Psychiatric:        Behavior: Behavior normal.     ED Results / Procedures / Treatments   Labs (all labs ordered are listed, but only abnormal results are displayed) Labs Reviewed  URINALYSIS, ROUTINE W REFLEX MICROSCOPIC - Abnormal; Notable for the following components:      Result  Value   Color, Urine COLORLESS (*)    Hgb urine dipstick SMALL (*)    All other components within normal limits  PREGNANCY, URINE  BASIC METABOLIC PANEL  CBC    EKG None  Radiology CT Renal Stone Study  Result Date: 12/24/2021 CLINICAL DATA:  Abdominal pain EXAM: CT ABDOMEN AND PELVIS WITHOUT CONTRAST TECHNIQUE: Multidetector CT imaging of the abdomen and pelvis was performed following the standard protocol without IV contrast. RADIATION DOSE REDUCTION: This exam was performed according to the departmental dose-optimization program which includes automated exposure control, adjustment of the mA and/or kV according to patient size and/or use of iterative reconstruction technique. COMPARISON:  None Available. FINDINGS: Lower chest: Lung bases are clear. Hepatobiliary: Unenhanced liver is unremarkable. Gallbladder is unremarkable. No intrahepatic or extrahepatic duct dilatation. Pancreas: Within normal limits. Spleen: Within normal limits. Adrenals/Urinary Tract: Adrenal glands are within normal limits. Kidneys are within normal limits. No renal calculi or hydronephrosis. Bladder is within normal limits. Stomach/Bowel: Stomach is within normal limits. No evidence of bowel obstruction. Normal appendix (series 2/image 56). No colonic wall thickening or inflammatory changes. Vascular/Lymphatic: No evidence of abdominal aortic aneurysm. No suspicious abdominopelvic lymphadenopathy. Reproductive: Uterus is notable for hemorrhage within the endometrial fundus (sagittal image 74), likely physiologic in the setting of active menses. Bilateral ovaries are within normal limits. Other: No abdominopelvic ascites. Musculoskeletal: Visualized osseous structures are within normal limits. IMPRESSION: No CT findings to account for the patient's abdominal pain. Electronically Signed   By: Charline Bills M.D.   On: 12/24/2021 20:27    Procedures Procedures  {Document cardiac monitor, telemetry assessment procedure  when appropriate:1}  Medications Ordered in ED Medications  sodium chloride 0.9 % bolus 1,000 mL (1,000 mLs Intravenous New Bag/Given 12/24/21 1959)  ondansetron (ZOFRAN) injection 4 mg (4 mg Intravenous Given 12/24/21 1958)  fentaNYL (SUBLIMAZE) injection 50 mcg (50 mcg Intravenous Given 12/24/21 1958)    ED Course/ Medical Decision Making/ A&P                           Medical Decision Making Amount and/or Complexity of Data Reviewed Labs: ordered. Decision-making details documented in ED Course. Radiology: ordered and independent interpretation performed. Decision-making details documented in ED Course. ECG/medicine tests: ordered and independent interpretation performed. Decision-making details documented in ED Course.  Risk Prescription drug management.   Low back pain for the past 5 days with frequency urgency and hematuria.  No fever.  Neurovascular intact.  Low suspicion for cord compression or cauda equina.  UA shows hematuria without evidence of infection.  States she was treated  by her PCP for UTI last week.  Comes in today with worsening back pain.  CT scan is obtained that shows no evidence of obstructive uropathy or other acute finding.  Results reviewed interpreted by me.  Heart rate has normalized.  No fever.  UA not consistent with infection.  Patient tolerating p.o. and ambulatory.  Pain is improved.  Suspect likely passed kidney stone.  Discussed PCP follow-up.  Return to the ED with new or worsening symptoms.  {Document critical care time when appropriate:1} {Document review of labs and clinical decision tools ie heart score, Chads2Vasc2 etc:1}  {Document your independent review of radiology images, and any outside records:1} {Document your discussion with family members, caretakers, and with consultants:1} {Document social determinants of health affecting pt's care:1} {Document your decision making why or why not admission, treatments were needed:1} Final  Clinical Impression(s) / ED Diagnoses Final diagnoses:  None    Rx / DC Orders ED Discharge Orders     None

## 2021-12-24 NOTE — Discharge Instructions (Signed)
As we discussed,  you may have passed a kidney stone.  No evidence of urinary tract infection.  Take the anti-inflammatories and nausea medication as prescribed.  Follow-up with your doctor.  Return to the ED with worsening pain, fever, vomiting, not able to urinate, any other concerns.

## 2021-12-24 NOTE — ED Triage Notes (Signed)
Patient here POV from Home.  Endorses Dysuria and Mid Back Pain that began 5 Days ago. Some Hematuria noted on Wednesday during PCP Visit. Given IM Injection of Antibiotics then but Symptoms have worsened since.   NAD Noted during Triage. A&Ox4. Gcs 15. Ambulatory.

## 2022-01-02 ENCOUNTER — Other Ambulatory Visit (HOSPITAL_COMMUNITY): Payer: Self-pay

## 2022-01-02 ENCOUNTER — Telehealth: Payer: Self-pay

## 2022-01-02 NOTE — Telephone Encounter (Addendum)
[facility] Patient Product/process development scientist completed.    The patient is insured through [employer] and has a $47.00 copay.  Patient will need a copay card to make copay $0.00    We will continue to follow to see if copay assistance is needed.  Clearance Coots, CPhT Specialty Pharmacy Patient [facility] Phone: [phone] Fax:  [phone]

## 2022-01-03 ENCOUNTER — Ambulatory Visit (INDEPENDENT_AMBULATORY_CARE_PROVIDER_SITE_OTHER): Payer: BC Managed Care – PPO | Admitting: Internal Medicine

## 2022-01-03 ENCOUNTER — Encounter: Payer: Self-pay | Admitting: Internal Medicine

## 2022-01-03 ENCOUNTER — Other Ambulatory Visit (HOSPITAL_COMMUNITY)
Admission: RE | Admit: 2022-01-03 | Discharge: 2022-01-03 | Disposition: A | Discharge status: Home / Self Care | Payer: BC Managed Care – PPO | Source: Ambulatory Visit | Attending: Internal Medicine | Admitting: Internal Medicine

## 2022-01-03 ENCOUNTER — Other Ambulatory Visit: Payer: Self-pay

## 2022-01-03 VITALS — BP 112/78 | HR 92 | Temp 97.7°F | Resp 16 | Ht 60.0 in | Wt 160.6 lb

## 2022-01-03 DIAGNOSIS — Z202 Contact with and (suspected) exposure to infections with a predominantly sexual mode of transmission: Secondary | ICD-10-CM

## 2022-01-03 NOTE — Addendum Note (Signed)
Addended byDanelle Earthly on: 01/03/2022 03:57 PM   Modules accepted: Level of Service

## 2022-01-03 NOTE — Progress Notes (Addendum)
Patient Active Problem List   Diagnosis Date Noted   Vaginal delivery 10/19/2013   Migraine, unspecified, without mention of intractable migraine without mention of status migrainosus 10/14/2013   History of HPV infection 10/14/2013    Patient's Medications  New Prescriptions   No medications on file  Previous Medications   JUNEL FE 1/20 1-20 MG-MCG TABLET    Take 1 tablet by mouth every evening.    KLONOPIN 0.5 MG TABLET    Take 1 tablet 3 times a day by oral route as needed.   NAPROXEN (NAPROSYN) 500 MG TABLET    Take 1 tablet (500 mg total) by mouth 2 (two) times daily with a meal.   ONDANSETRON (ZOFRAN-ODT) 4 MG DISINTEGRATING TABLET    Take 1 tablet (4 mg total) by mouth every 8 (eight) hours as needed for nausea or vomiting.   TRAMADOL (ULTRAM) 50 MG TABLET    Take 1-2 tablets (50-100 mg total) by mouth every 6 (six) hours as needed for moderate pain.  Modified Medications   No medications on file  Discontinued Medications   No medications on file    Subjective: 31 YF PMHX as below presents with + HIV test on 12/22/21. Looked up [facility] and HIV 4th gen Reactive, reflex ab negative, HIV RNA negative on 09/04/2018 c/w false positive. She was seen by PCP and HIV screen returned positive. I don't see reflex testing. HIV RNA ordered, no results conveyed.  -RPR NR on 11/17 Hx: Chlamydia 2012. HepB Ab negative Sexually active with one female partner (2 months).  Review of Systems: Review of Systems  All other systems reviewed and are negative.   Past Medical History:  Diagnosis Date   Anemia    when pregnant   Complication of anesthesia    ringing in the ears, blood pressure dropped,"felt like i would pass out" with epidural   No pertinent past medical history    PID (acute pelvic inflammatory disease)     Social History   Tobacco Use   Smoking status: Never   Smokeless tobacco: Never  Vaping Use   Vaping Use: Never used  Substance Use Topics   Alcohol  use: Yes    Comment: occasionally    Drug use: No    Family History  Problem Relation Age of Onset   Cancer Paternal Aunt    Heart disease Paternal Grandfather    Stroke Paternal Grandfather     Allergies  Allergen Reactions   Meloxicam Hives and Rash    Rash from face down body    Health Maintenance  Topic Date Due   COVID-19 Vaccine (1) Never done   Hepatitis C Screening  Never done   PAP SMEAR-Modifier  Never done   INFLUENZA VACCINE  Never done   HIV Screening  Completed   HPV VACCINES  Aged Out    Objective:  Vitals:   01/03/22 1500  BP: 112/78  Pulse: 92  Resp: 16  Temp: 97.7 F (36.5 C)  TempSrc: Oral  SpO2: 99%  Weight: 160 lb 9.6 oz (72.8 kg)  Height: 5' (1.524 m)   Body mass index is 31.37 kg/m.  Physical Exam Constitutional:      Appearance: Normal appearance.  HENT:     Head: Normocephalic and atraumatic.     Right Ear: Tympanic membrane normal.     Left Ear: Tympanic membrane normal.     Nose: Nose normal.     Mouth/Throat:  Mouth: Mucous membranes are moist.  Eyes:     Extraocular Movements: Extraocular movements intact.     Conjunctiva/sclera: Conjunctivae normal.     Pupils: Pupils are equal, round, and reactive to light.  Cardiovascular:     Rate and Rhythm: Normal rate and regular rhythm.     Heart sounds: No murmur heard.    No friction rub. No gallop.  Pulmonary:     Effort: Pulmonary effort is normal.     Breath sounds: Normal breath sounds.  Abdominal:     General: Abdomen is flat.     Palpations: Abdomen is soft.  Musculoskeletal:        General: Normal range of motion.  Skin:    General: Skin is warm and dry.  Neurological:     General: No focal deficit present.     Mental Status: She is alert and oriented to person, place, and time.  Psychiatric:        Mood and Affect: Mood normal.     Lab Results Lab Results  Component Value Date   WBC 4.4 12/24/2021   HGB 14.0 12/24/2021   HCT 41.3 12/24/2021   MCV  91.0 12/24/2021   PLT 220 12/24/2021    Lab Results  Component Value Date   CREATININE 0.68 12/24/2021   BUN 9 12/24/2021   NA 138 12/24/2021   K 3.7 12/24/2021   CL 102 12/24/2021   CO2 28 12/24/2021    Lab Results  Component Value Date   ALT 21 06/16/2014   AST 27 06/16/2014   ALKPHOS 84 06/16/2014   BILITOT 0.2 (L) 06/16/2014    No results found for: "CHOL", "HDL", "LDLCALC", "LDLDIRECT", "TRIG", "CHOLHDL" Lab Results  Component Value Date   LABRPR NON REAC 10/19/2013   No results found for: "HIV1RNAQUANT", "HIV1RNAVL", "CD4TABS"   Problem List Items Addressed This Visit   None Visit Diagnoses     STD exposure    -  Primary   Relevant Orders   HIV-1 RNA quant-no reflex-bld   HIV Antibody (routine testing w rflx)   RPR   Urine cytology ancillary only      #HIV screen +,  with Hx of false + screen in 2020(reviewed [facility] records -Will order HIV ab and HIV RNA, I Suspect her screen on 11/17 was FP as such will hold off on ART.  -GC urine. No vaginal discharge. Chlamydia in 2012 -Contraception use, will call pt with results.  -Follow-up PRN unless testing is positive    Danelle Earthly, MD [facility] 01/03/2022, 3:11 PM

## 2022-01-04 LAB — URINE CYTOLOGY ANCILLARY ONLY
Chlamydia: NEGATIVE
Comment: NEGATIVE
Comment: NORMAL
Neisseria Gonorrhea: NEGATIVE

## 2022-01-09 LAB — HIV-1 RNA QUANT-NO REFLEX-BLD
HIV 1 RNA Quant: NOT DETECTED Copies/mL
HIV-1 RNA Quant, Log: NOT DETECTED Log cps/mL

## 2022-01-09 LAB — HIV ANTIBODY (ROUTINE TESTING W REFLEX): HIV 1&2 Ab, 4th Generation: REACTIVE — AB

## 2022-01-09 LAB — HIV-1/2 AB - DIFFERENTIATION
HIV-1 antibody: NEGATIVE
HIV-2 Ab: NEGATIVE

## 2022-01-09 LAB — HIV 1 RNA, QL RT PCR: HIV-1 RNA, Qualitative, TMA: NOT DETECTED

## 2022-01-09 LAB — HEPATITIS C ANTIBODY: Hepatitis C Ab: NONREACTIVE
